# Patient Record
Sex: Female | Born: 1965 | Race: Black or African American | Hispanic: No | Smoking: Never smoker
Health system: Southern US, Community
[De-identification: ages and names within clinical notes are randomized; demographics above are authoritative.]

## PROBLEM LIST (undated history)

## (undated) DIAGNOSIS — G43909 Migraine, unspecified, not intractable, without status migrainosus: Secondary | ICD-10-CM

## (undated) HISTORY — DX: Migraine, unspecified, not intractable, without status migrainosus: G43.909

## (undated) HISTORY — PX: CHOLECYSTECTOMY: SHX55

## (undated) HISTORY — PX: BUNIONECTOMY: SHX129

---

## 1998-11-25 ENCOUNTER — Other Ambulatory Visit: Admission: RE | Admit: 1998-11-25 | Discharge: 1998-11-25 | Payer: Self-pay | Admitting: *Deleted

## 1999-05-01 ENCOUNTER — Inpatient Hospital Stay (HOSPITAL_COMMUNITY): Admission: AD | Admit: 1999-05-01 | Discharge: 1999-05-04 | Payer: Self-pay | Admitting: *Deleted

## 1999-05-04 ENCOUNTER — Encounter: Payer: Self-pay | Admitting: *Deleted

## 1999-06-28 ENCOUNTER — Inpatient Hospital Stay (HOSPITAL_COMMUNITY): Admission: AD | Admit: 1999-06-28 | Discharge: 1999-07-01 | Payer: Self-pay | Admitting: Obstetrics & Gynecology

## 1999-08-20 ENCOUNTER — Other Ambulatory Visit: Admission: RE | Admit: 1999-08-20 | Discharge: 1999-08-20 | Payer: Self-pay | Admitting: Obstetrics & Gynecology

## 2000-08-19 ENCOUNTER — Other Ambulatory Visit: Admission: RE | Admit: 2000-08-19 | Discharge: 2000-08-19 | Payer: Self-pay | Admitting: *Deleted

## 2001-09-12 ENCOUNTER — Other Ambulatory Visit: Admission: RE | Admit: 2001-09-12 | Discharge: 2001-09-12 | Payer: Self-pay | Admitting: Obstetrics and Gynecology

## 2002-09-14 ENCOUNTER — Other Ambulatory Visit: Admission: RE | Admit: 2002-09-14 | Discharge: 2002-09-14 | Payer: Self-pay | Admitting: Obstetrics and Gynecology

## 2005-02-11 ENCOUNTER — Encounter: Admission: RE | Admit: 2005-02-11 | Discharge: 2005-02-11 | Payer: Self-pay | Admitting: Obstetrics & Gynecology

## 2006-02-14 ENCOUNTER — Encounter: Admission: RE | Admit: 2006-02-14 | Discharge: 2006-02-14 | Payer: Self-pay | Admitting: Obstetrics and Gynecology

## 2006-02-23 ENCOUNTER — Encounter: Admission: RE | Admit: 2006-02-23 | Discharge: 2006-02-23 | Payer: Self-pay | Admitting: Obstetrics and Gynecology

## 2007-02-27 ENCOUNTER — Encounter: Admission: RE | Admit: 2007-02-27 | Discharge: 2007-02-27 | Payer: Self-pay | Admitting: Obstetrics and Gynecology

## 2008-02-28 ENCOUNTER — Encounter: Admission: RE | Admit: 2008-02-28 | Discharge: 2008-02-28 | Payer: Self-pay | Admitting: Obstetrics and Gynecology

## 2009-02-20 ENCOUNTER — Emergency Department (HOSPITAL_COMMUNITY): Admission: EM | Admit: 2009-02-20 | Discharge: 2009-02-20 | Payer: Self-pay | Admitting: Emergency Medicine

## 2009-02-28 ENCOUNTER — Encounter: Admission: RE | Admit: 2009-02-28 | Discharge: 2009-02-28 | Payer: Self-pay | Admitting: Obstetrics and Gynecology

## 2010-03-02 ENCOUNTER — Encounter
Admission: RE | Admit: 2010-03-02 | Discharge: 2010-03-02 | Payer: Self-pay | Source: Home / Self Care | Attending: Obstetrics and Gynecology | Admitting: Obstetrics and Gynecology

## 2010-03-08 ENCOUNTER — Encounter: Payer: Self-pay | Admitting: Obstetrics and Gynecology

## 2010-05-03 LAB — DIFFERENTIAL
Basophils Absolute: 0 10*3/uL (ref 0.0–0.1)
Basophils Relative: 0 % (ref 0–1)
Eosinophils Absolute: 0 10*3/uL (ref 0.0–0.7)
Eosinophils Relative: 1 % (ref 0–5)
Lymphocytes Relative: 19 % (ref 12–46)
Lymphs Abs: 1.1 10*3/uL (ref 0.7–4.0)
Monocytes Absolute: 0.5 10*3/uL (ref 0.1–1.0)
Monocytes Relative: 8 % (ref 3–12)
Neutro Abs: 4.3 10*3/uL (ref 1.7–7.7)
Neutrophils Relative %: 73 % (ref 43–77)

## 2010-05-03 LAB — CBC
HCT: 37.1 % (ref 36.0–46.0)
Hemoglobin: 12.2 g/dL (ref 12.0–15.0)
MCHC: 32.9 g/dL (ref 30.0–36.0)
MCV: 86.6 fL (ref 78.0–100.0)
Platelets: 225 K/uL (ref 150–400)
RBC: 4.29 MIL/uL (ref 3.87–5.11)
RDW: 14.6 % (ref 11.5–15.5)
WBC: 6 K/uL (ref 4.0–10.5)

## 2010-05-03 LAB — URINALYSIS, ROUTINE W REFLEX MICROSCOPIC
Bilirubin Urine: NEGATIVE
Glucose, UA: NEGATIVE mg/dL
Hgb urine dipstick: NEGATIVE
Ketones, ur: NEGATIVE mg/dL
Nitrite: NEGATIVE
Protein, ur: NEGATIVE mg/dL
Specific Gravity, Urine: 1.023 (ref 1.005–1.030)
Urobilinogen, UA: 1 mg/dL (ref 0.0–1.0)
pH: 8 (ref 5.0–8.0)

## 2010-05-03 LAB — CK TOTAL AND CKMB (NOT AT ARMC): CK, MB: 0.8 ng/mL (ref 0.3–4.0)

## 2010-05-03 LAB — BASIC METABOLIC PANEL
BUN: 7 mg/dL (ref 6–23)
CO2: 28 mEq/L (ref 19–32)
Calcium: 9 mg/dL (ref 8.4–10.5)
Chloride: 108 mEq/L (ref 96–112)
Creatinine, Ser: 0.63 mg/dL (ref 0.4–1.2)
GFR calc Af Amer: >60 mL/min (ref >60)
GFR calc non Af Amer: >60 mL/min (ref >60)
Glucose, Bld: 141 mg/dL — ABNORMAL HIGH (ref 70–99)
Potassium: 4 mEq/L (ref 3.5–5.1)
Sodium: 140 mEq/L (ref 135–145)

## 2010-07-03 NOTE — H&P (Signed)
[facility]  Patient:    Kayla Finley, Kayla Finley                      MRN: [id number] Adm. Date:  [id number] Attending:  Ardeen Fillers CC:         Sung Amabile. Roslyn Smiling, M.D.                         History and Physical  DATE OF BIRTH:                    1965/04/26  CHIEF COMPLAINT:                  Severe oligohydramnios at [redacted] weeks gestational age.  HISTORY OF PRESENT ILLNESS:       A 45 year old woman, G3, P1-0-1-1, EDC of Jul 07, 1999, based on dates and mid second trimester ultrasound, who was seen in the office for routine evaluation this morning.  Ultrasound was ordered because of patients history of oligohydramnios in the previous pregnancy.  Ultrasound revealed estimated fetal weight of 39% but severe oligohydramnios with and AFI f 8.2 which is considered equal to or less than the third percentile at [redacted] weeks gestational age.  Uterine Dopplers were normal today.  The patient is admitted ow for 48 hours of hydration and reevaluation of AFI after complete bed rest and hydration.  ANTENATAL COURSE:                 Remarkable for: 1. Advanced maternal age.  Amniocentesis was performed at [redacted] weeks gestational ge    and revealed normal female chromosomes and normal amniotic fluid    alpha-fetoprotein. 2. Anemia, treated with iron.  PAST MEDICAL HISTORY:             Medical: None.  Surgical: Cholecystectomy, cesarean section in 1997.  Obstetrical: 1998 first trimester TAB; 1997 full-term pregnancy at [redacted] weeks gestational age, 6 pound 46 ounce female delivered by cesarean section because of fetal distress.  ALLERGIES:                        No known drug allergies.  MEDICATIONS:                      Prenatal vitamins and iron.  FAMILY HISTORY:                   Mother with a history of hypertension and adult onset diabetes mellitus.  She died of breast cancer at age 15.  Father died of stroke.  SOCIAL HISTORY:                   Married.   Accountant.  Denies tobacco or ethanol use.  PHYSICAL EXAMINATION:  GENERAL:                          A healthy-appearing woman.  VITAL SIGNS:                      Afebrile.  Blood pressure 100/64.  Fetal heart rate 120s to 130s.  HEENT:                            Within normal limits.  Braces on teeth.  NECK:  Supple without thyromegaly.  CHEST:                            Clear.  COR:                              Regular rate and rhythm.  S1 and S2 normal.  ABDOMEN:                          Soft and nontender.  Fundal height 30 cm. Fundus nonirritable.  GU:                               Not examined.  EXTREMITIES:                      Clubbing, cyanosis, or edema.  NEUROLOGIC:                       DTRs 2+ without clonus.  ANTENATAL LABORATORY DATA:        AB positive, RPR nonreactive, rubella immune.  Hepatitis B surface antigen negative.  HIV nonreactive.  Gonorrhea and chlamydia cultures negative.  Amniotic fluid alpha-fetoprotein within normal limits. One-hour glucola 42 with a normal three-hour glucose tolerance test.  Group B Strep not performed.                                    Monitoring showed reactive tracing and no uterine contractions.  IMPRESSION:                       1. Severe oligohydramnios with intrauterine                                      pregnancy at 30+ weeks gestational age.                                   2. Rh positive.                                   3. History of cesarean section.  PLAN:                             Admit for IV hydration.  Strict bed rest over the weekend.  Reevaluate AFI on Monday, March 19.  Nonstress test twice daily. DD:  05/01/99 TD:  05/01/99 Job: [id number]

## 2010-07-03 NOTE — H&P (Signed)
[facility]  Patient:    Kayla Finley, Kayla Finley                      MRN: [id number] Adm. Date:  [id number] Attending:  Genia Del                         History and Physical  HISTORY OF PRESENT ILLNESS:  The patient is a 45 year old woman G3, P1, A1 with last menstrual period on September 29, 1998, for an expected date of delivery on Jul 07, 1999, at 38 weeks and 6 days gestation.  REASON FOR ADMISSION:  Uterine contractions every 2-5 minutes since 10 p.m. on Jun 28, 1999.  HISTORY OF PRESENT ILLNESS:  The patient noted more contractions x 24 hours before presentation but the contractions became regular every 3 to 5 minutes at 10 p.m. on Jun 28, 1999.  She had no fluid leak, no vaginal bleeding, good fetal movements and no PIH symptoms.  PAST MEDICAL HISTORY:  Negative.  PAST SURGICAL HISTORY:  Cholecystectomy.  PAST OBSTETRICAL HISTORY:  September 1997, 40 weeks induction for oligohydramnios and then fetal monitoring during labor with nonreassuring and a cesarean section was done, low transverse, baby female, 6 pounds 14 ounces. No other complications.  In 1988 at 6 weeks, therapeutic abortion, no complication.  ALLERGIES:  No known drug allergies.  SOCIAL HISTORY:  She is a nonsmoker and is married.  MEDICATIONS:  Prenatal vitamins.  HISTORY OF PRESENT PREGNANCY:  First trimester was normal.  The labs in the first trimester showed a hemoglobin of 11.6, platelets at 247, blood type rH AB positive, antibodies negative.  Sickle cell trait negative in 1997. Protein electrophoresis normal.  RPR nonreactive.  HBsAg negative.  HIV nonreactive, rubella immune, gonorrhea, chlamydia negative.  In the second trimester she had an amniocentesis showing 4 XY female normal chromosomes.  Her 20 week ultrasound showed normal review of anatomy with normal placenta and fluid.  She had a 1 hour GTT at 27 plus weeks which was increased.  The 3 hour GTT was  normal.  Group B strep at 35 plus weeks was positive.  Blood pressure remained normal throughout pregnancy.  REVIEW OF SYSTEMS:  Constitutional:  Negative.  Respiratory:  Negative. Cardiovascular:  Negative.  GI:  Negative.  Urologic:  Negative.  Neurologic: Negative.  PHYSICAL EXAMINATION:  GENERAL:  The patient is in no apparent distress except for the uterine contractions.  VITAL SIGNS:  Temperature was 97.7, pulse 84, respiratory rate 20 per minute, blood pressure 121/69.  HEENT:  Within normal limits.  LUNGS:  Clear bilaterally.  HEART:  Regular cardiac rhythm, no murmur.  ABDOMEN:  Gravid with a uterine height at 37 cm.  Nontender vertex.  VAGINAL EXAM ON ADMISSION:  Showed a cervix of 1 cm dilatation long, vertex minus 2 station.  Membranes were intact.  On my evaluation the cervix had changed from 3 to 3+ cm and was 90% effaced vertex -1,0 and the patient was in labor.  Artificial rupture of membranes was done and the amniotic fluid was clear.   Lower limbs had mild edema.  Monitoring fetal heart rates baseline was 140 per minute with good accelerations, no decelerations.  Uterine contractions every 2 to 4 minutes with a duration of 50 seconds 2+.  IMPRESSION:  A 45 year old woman G3, P1, A1 at 38 weeks and 6 days gestation in early labor with reassuring fetal well-being, vaginal birth after cesarean  section candidate, with group B strep positive.  PLAN:  Continuous monitoring, penicillin G to cover for Group B strep positive according to protocol.  Expectant towards a probable vaginal delivery. DD:  06/29/99 TD:  06/29/99 Job: [id number]

## 2010-07-03 NOTE — Discharge Summary (Signed)
[facility]  Patient:    Kayla Finley, Kayla Finley                      MRN: [id number] Adm. Date:  [id number] Disc. Date: [id number] Attending:  Ardeen Fillers CC:         Sung Amabile. Roslyn Smiling, M.D.                           Discharge Summary  DISCHARGE DIAGNOSES:          1. Oligohydramnios.                               2. Intrauterine pregnancy at [redacted] weeks gestational                                  age.  HISTORY OF PRESENT ILLNESS:   [age]-year-old woman, G3, P1-0-1-1, [facility] Jul 07, 1999, based on dates and mid second trimester ultrasound, who was seen in the office for routine evaluation on May 01, 1999.  Ultrasound was ordered because of the patients history of oligohydramnios in the previous pregnancy.  Ultrasound revealed an estimated fetal weight in the 39th percentile, but severe oligohydramnios with an AFI of 8.2 which is considered equal to or less than the third percentile at [redacted] weeks gestational age was noted.  Uterine Dopplers were normal.  The patient was admitted to [facility] for strict bed rest and aggressive IV hydration.  HOSPITAL COURSE:              She was kept at strict bed rest.  Intermittent monitoring was carried out and was reassuring.  Ultrasound was performed on May 04, 1999, which revealed an AFI of 11.3.  The patient was discharged to home with instructions for strict bed rest.  She will be followed up by Dr. Roslyn Smiling in one  week.  Kick counts were encouraged. DD:  05/20/99 TD:  05/21/99 Job: [id number]

## 2011-02-04 ENCOUNTER — Other Ambulatory Visit: Payer: Self-pay | Admitting: Obstetrics and Gynecology

## 2011-02-04 DIAGNOSIS — Z1231 Encounter for screening mammogram for malignant neoplasm of breast: Secondary | ICD-10-CM

## 2011-03-05 ENCOUNTER — Ambulatory Visit: Payer: Self-pay

## 2011-03-09 ENCOUNTER — Ambulatory Visit
Admission: RE | Admit: 2011-03-09 | Discharge: 2011-03-09 | Disposition: A | Discharge status: Home / Self Care | Payer: BC Managed Care – PPO | Source: Ambulatory Visit | Attending: Obstetrics and Gynecology | Admitting: Obstetrics and Gynecology

## 2011-03-09 DIAGNOSIS — Z1231 Encounter for screening mammogram for malignant neoplasm of breast: Secondary | ICD-10-CM

## 2012-02-04 ENCOUNTER — Other Ambulatory Visit: Payer: Self-pay | Admitting: Obstetrics and Gynecology

## 2012-02-04 DIAGNOSIS — Z1231 Encounter for screening mammogram for malignant neoplasm of breast: Secondary | ICD-10-CM

## 2012-03-10 ENCOUNTER — Ambulatory Visit
Admission: RE | Admit: 2012-03-10 | Discharge: 2012-03-10 | Disposition: A | Discharge status: Home / Self Care | Payer: BC Managed Care – PPO | Source: Ambulatory Visit | Attending: Obstetrics and Gynecology | Admitting: Obstetrics and Gynecology

## 2012-03-10 DIAGNOSIS — Z1231 Encounter for screening mammogram for malignant neoplasm of breast: Secondary | ICD-10-CM

## 2012-03-13 ENCOUNTER — Other Ambulatory Visit: Payer: Self-pay | Admitting: Obstetrics and Gynecology

## 2012-03-13 DIAGNOSIS — R928 Other abnormal and inconclusive findings on diagnostic imaging of breast: Secondary | ICD-10-CM

## 2012-03-17 ENCOUNTER — Ambulatory Visit
Admission: RE | Admit: 2012-03-17 | Discharge: 2012-03-17 | Disposition: A | Discharge status: Home / Self Care | Payer: BC Managed Care – PPO | Source: Ambulatory Visit | Attending: Obstetrics and Gynecology | Admitting: Obstetrics and Gynecology

## 2012-03-17 DIAGNOSIS — R928 Other abnormal and inconclusive findings on diagnostic imaging of breast: Secondary | ICD-10-CM

## 2013-02-07 ENCOUNTER — Other Ambulatory Visit: Payer: Self-pay

## 2013-02-07 DIAGNOSIS — Z1231 Encounter for screening mammogram for malignant neoplasm of breast: Secondary | ICD-10-CM

## 2013-03-12 ENCOUNTER — Ambulatory Visit
Admission: RE | Admit: 2013-03-12 | Discharge: 2013-03-12 | Disposition: A | Discharge status: Home / Self Care | Payer: Self-pay | Source: Ambulatory Visit

## 2013-03-12 DIAGNOSIS — Z1231 Encounter for screening mammogram for malignant neoplasm of breast: Secondary | ICD-10-CM

## 2013-11-21 ENCOUNTER — Other Ambulatory Visit: Payer: Self-pay | Admitting: Family Medicine

## 2013-11-21 ENCOUNTER — Ambulatory Visit
Admission: RE | Admit: 2013-11-21 | Discharge: 2013-11-21 | Disposition: A | Discharge status: Home / Self Care | Payer: BC Managed Care – PPO | Source: Ambulatory Visit | Attending: Family Medicine | Admitting: Family Medicine

## 2013-11-21 DIAGNOSIS — M25561 Pain in right knee: Secondary | ICD-10-CM

## 2013-12-28 ENCOUNTER — Other Ambulatory Visit: Payer: Self-pay | Admitting: *Deleted

## 2014-02-05 ENCOUNTER — Other Ambulatory Visit: Payer: Self-pay

## 2014-02-05 DIAGNOSIS — Z1231 Encounter for screening mammogram for malignant neoplasm of breast: Secondary | ICD-10-CM

## 2014-02-26 ENCOUNTER — Encounter: Payer: Self-pay | Admitting: Internal Medicine

## 2014-03-13 ENCOUNTER — Ambulatory Visit
Admission: RE | Admit: 2014-03-13 | Discharge: 2014-03-13 | Disposition: A | Discharge status: Home / Self Care | Payer: BC Managed Care – PPO | Source: Ambulatory Visit

## 2014-03-13 DIAGNOSIS — Z1231 Encounter for screening mammogram for malignant neoplasm of breast: Secondary | ICD-10-CM

## 2014-04-08 ENCOUNTER — Ambulatory Visit (AMBULATORY_SURGERY_CENTER): Payer: Self-pay | Admitting: *Deleted

## 2014-04-08 VITALS — Ht 65.0 in | Wt 134.0 lb

## 2014-04-08 DIAGNOSIS — Z1211 Encounter for screening for malignant neoplasm of colon: Secondary | ICD-10-CM

## 2014-04-08 MED ORDER — MOVIPREP 100 G PO SOLR: ORAL | Status: DC

## 2014-04-08 NOTE — Progress Notes (Signed)
No egg or soy allergy  No anesthesia or intubation problems per pt  No diet medications taken  Registered in EMMI   

## 2014-04-22 ENCOUNTER — Ambulatory Visit (AMBULATORY_SURGERY_CENTER): Payer: BC Managed Care – PPO | Admitting: Internal Medicine

## 2014-04-22 ENCOUNTER — Encounter: Payer: Self-pay | Admitting: Internal Medicine

## 2014-04-22 VITALS — BP 103/65 | HR 54 | Temp 97.7°F | Resp 19 | Ht 65.0 in | Wt 134.0 lb

## 2014-04-22 DIAGNOSIS — Z1211 Encounter for screening for malignant neoplasm of colon: Secondary | ICD-10-CM

## 2014-04-22 MED ORDER — SODIUM CHLORIDE 0.9 % IV SOLN: 500.0000 mL | INTRAVENOUS | Status: DC

## 2014-04-22 NOTE — Patient Instructions (Signed)
YOU HAD AN ENDOSCOPIC PROCEDURE TODAY AT THE [facility]:   Refer to the procedure report that was given to you for any specific questions about what was found during the examination.  If the procedure report does not answer your questions, please call your gastroenterologist to clarify.  If you requested that your care partner not be given the details of your procedure findings, then the procedure report has been included in a sealed envelope for you to review at your convenience later.  YOU SHOULD EXPECT: Some feelings of bloating in the abdomen. Passage of more gas than usual.  Walking can help get rid of the air that was put into your GI tract during the procedure and reduce the bloating. If you had a lower endoscopy (such as a colonoscopy or flexible sigmoidoscopy) you may notice spotting of blood in your stool or on the toilet paper. If you underwent a bowel prep for your procedure, you may not have a normal bowel movement for a few days.  Please Note:  You might notice some irritation and congestion in your nose or some drainage.  This is from the oxygen used during your procedure.  There is no need for concern and it should clear up in a day or so.  SYMPTOMS TO REPORT IMMEDIATELY:   Following lower endoscopy (colonoscopy or flexible sigmoidoscopy):  Excessive amounts of blood in the stool  Significant tenderness or worsening of abdominal pains  Swelling of the abdomen that is new, acute  Fever of 100F or higher   Following upper endoscopy (EGD)  Vomiting of blood or coffee ground material  New chest pain or pain under the shoulder blades  Painful or persistently difficult swallowing  New shortness of breath  Fever of 100F or higher  Black, tarry-looking stools  For urgent or emergent issues, a gastroenterologist can be reached at any hour by calling [phone].   DIET: Your first meal following the procedure should be a small meal and then it is ok to progress to  your normal diet. Heavy or fried foods are harder to digest and may make you feel nauseous or bloated.  Likewise, meals heavy in dairy and vegetables can increase bloating.  Drink plenty of fluids but you should avoid alcoholic beverages for 24 hours. Try to increase your fiber in your diet.  ACTIVITY:  You should plan to take it easy for the rest of today and you should NOT DRIVE or use heavy machinery until tomorrow (because of the sedation medicines used during the test).    FOLLOW UP: Our staff will call the number listed on your records the next business day following your procedure to check on you and address any questions or concerns that you may have regarding the information given to you following your procedure. If we do not reach you, we will leave a message.  However, if you are feeling well and you are not experiencing any problems, there is no need to return our call.  We will assume that you have returned to your regular daily activities without incident.  If any biopsies were taken you will be contacted by phone or by letter within the next 1-3 weeks.  Please call us at [phone] if you have not heard about the biopsies in 3 weeks.    SIGNATURES/CONFIDENTIALITY: You and/or your care partner have signed paperwork which will be entered into your electronic medical record.  These signatures attest to the fact that that the information above  on your After Visit Summary has been reviewed and is understood.  Full responsibility of the confidentiality of this discharge information lies with you and/or your care-partner.  Read the handouts given to you by your recovery room nurse.

## 2014-04-22 NOTE — Progress Notes (Signed)
A/ox3, pleased with MAC, report to RN 

## 2014-04-22 NOTE — Op Note (Signed)
 [facility] [address] [place], [id number]   COLONOSCOPY PROCEDURE REPORT  PATIENT: Kayla Finley, [name]  MR#: [id number] BIRTHDATE: [birth year] , 48  yrs. old GENDER: female ENDOSCOPIST: Roxy CedarJohn N Eston Heslin Jr, MD REFERRED WN:UUVOZBY:Aaron Vincente LibertyS Morrow, M.D. PROCEDURE DATE:  04/22/2014 PROCEDURE:   Colonoscopy, screening First Screening Colonoscopy - Avg.  risk and is 50 yrs.  old or older Yes.  Prior Negative Screening - Now for repeat screening. N/A  History of Adenoma - Now for follow-up colonoscopy & has been > or = to 3 yrs.  N/A  Polyps Removed Today? No.  Recommend repeat exam, <10 yrs? No. ASA CLASS:   Class II INDICATIONS:average risk patient for colorectal cancer, Screening for colonic neoplasia, and Average Risk. MEDICATIONS: Monitored anesthesia care and Propofol 200 mg IV  DESCRIPTION OF PROCEDURE:   After the risks benefits and alternatives of the procedure were thoroughly explained, informed consent was obtained.  The digital rectal exam revealed no abnormalities of the rectum.   The LB [id number]  endoscope was introduced through the anus and advanced to the cecum, which was identified by both the appendix and ileocecal valve. No adverse events experienced.   The quality of the prep was excellent.  using MoviPrep  The instrument was then slowly withdrawn as the colon was fully examined.      COLON FINDINGS: There was mild diverticulosis noted in the sigmoid colon.   The examination was otherwise normal.  Retroflexed views revealed no abnormalities. The time to cecum = 1.42 Withdrawal time = 8.56   The scope was withdrawn and the procedure completed.  COMPLICATIONS: There were no immediate complications.  ENDOSCOPIC IMPRESSION: 1.   Mild diverticulosis was noted in the sigmoid colon 2.   The examination was otherwise normal  RECOMMENDATIONS: 1. Continue current colorectal screening recommendations for "routine risk" patients with a repeat colonoscopy  in 10 years.  eSigned:  Roxy CedarJohn N Dontrelle Mazon Jr, MD 04/22/2014 8:33 AM   cc: Farris HasMorrow, Aaron MD and The Patient

## 2014-04-22 NOTE — Progress Notes (Signed)
May discharge in 20 minutes if stable per Dr. Perry. 

## 2014-04-23 ENCOUNTER — Telehealth: Payer: Self-pay | Admitting: *Deleted

## 2014-04-23 NOTE — Telephone Encounter (Signed)
  Follow up Call-  Call back number 04/22/2014  Post procedure Call Back phone  # [phone]  Permission to leave phone message Yes   [facility]

## 2014-04-25 NOTE — Addendum Note (Signed)
Addended by: Noela Brothers W on: 04/25/2014 01:58 PM   Modules accepted: Level of Service  

## 2015-02-06 ENCOUNTER — Other Ambulatory Visit: Payer: Self-pay

## 2015-02-06 DIAGNOSIS — Z1231 Encounter for screening mammogram for malignant neoplasm of breast: Secondary | ICD-10-CM

## 2015-03-17 ENCOUNTER — Ambulatory Visit
Admission: RE | Admit: 2015-03-17 | Discharge: 2015-03-17 | Disposition: A | Discharge status: Home / Self Care | Payer: BC Managed Care – PPO | Source: Ambulatory Visit

## 2015-03-17 DIAGNOSIS — Z1231 Encounter for screening mammogram for malignant neoplasm of breast: Secondary | ICD-10-CM

## 2015-03-18 ENCOUNTER — Other Ambulatory Visit: Payer: Self-pay | Admitting: Obstetrics and Gynecology

## 2015-03-18 DIAGNOSIS — R928 Other abnormal and inconclusive findings on diagnostic imaging of breast: Secondary | ICD-10-CM

## 2015-03-24 ENCOUNTER — Ambulatory Visit
Admission: RE | Admit: 2015-03-24 | Discharge: 2015-03-24 | Disposition: A | Discharge status: Home / Self Care | Payer: BC Managed Care – PPO | Source: Ambulatory Visit | Attending: Obstetrics and Gynecology | Admitting: Obstetrics and Gynecology

## 2015-03-24 DIAGNOSIS — R928 Other abnormal and inconclusive findings on diagnostic imaging of breast: Secondary | ICD-10-CM

## 2016-01-20 ENCOUNTER — Ambulatory Visit: Payer: Self-pay | Admitting: Sports Medicine

## 2016-01-29 ENCOUNTER — Ambulatory Visit (INDEPENDENT_AMBULATORY_CARE_PROVIDER_SITE_OTHER): Payer: BC Managed Care – PPO | Admitting: Podiatry

## 2016-01-29 ENCOUNTER — Encounter: Payer: Self-pay | Admitting: Podiatry

## 2016-01-29 VITALS — BP 122/66 | HR 74 | Resp 16 | Ht 65.0 in | Wt 139.0 lb

## 2016-01-29 DIAGNOSIS — B351 Tinea unguium: Secondary | ICD-10-CM

## 2016-01-29 DIAGNOSIS — L603 Nail dystrophy: Secondary | ICD-10-CM

## 2016-01-29 MED ORDER — TERBINAFINE HCL 250 MG PO TABS: ORAL_TABLET | ORAL | 0 refills | Status: DC

## 2016-01-29 NOTE — Progress Notes (Signed)
Subjective:     Patient ID: Kayla Finley, female   DOB: 08/20/1965, 50 y.o.   MRN: [id number]  HPI patient states my nail disease bilateral with thickness subungual debris and history of having taken Lamisil which is improved the condition moderately but it is still quite bothersome for her   Review of Systems  All other systems reviewed and are negative.      Objective:   Physical Exam  Constitutional: She is oriented to person, place, and time.  Cardiovascular: Intact distal pulses.   Musculoskeletal: Normal range of motion.  Neurological: She is oriented to person, place, and time.  Skin: Skin is warm.  Nursing note and vitals reviewed.  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 I explained this may or may not give better correction but I do think it offers a better chance of good result

## 2016-01-29 NOTE — Progress Notes (Signed)
   Subjective:    Patient ID: Kayla Finley, female    DOB: 07/12/1965, 50 y.o.   MRN: [id number]  HPI Chief Complaint  Patient presents with  . Nail Problem    Bilateral; all toes; nail discoloration and thickened nails; pt stated, "Just finished Lamisil in mid-Augus 2017; needs to have all nails checked for nail fungus"      Review of Systems  All other systems reviewed and are negative.      Objective:   Physical Exam        Assessment & Plan:

## 2016-02-25 ENCOUNTER — Other Ambulatory Visit: Payer: Self-pay | Admitting: Obstetrics and Gynecology

## 2016-02-25 DIAGNOSIS — Z1231 Encounter for screening mammogram for malignant neoplasm of breast: Secondary | ICD-10-CM

## 2016-03-04 ENCOUNTER — Other Ambulatory Visit: Payer: BC Managed Care – PPO

## 2016-03-15 ENCOUNTER — Ambulatory Visit: Payer: BC Managed Care – PPO

## 2016-03-15 DIAGNOSIS — B351 Tinea unguium: Secondary | ICD-10-CM

## 2016-03-17 ENCOUNTER — Ambulatory Visit
Admission: RE | Admit: 2016-03-17 | Discharge: 2016-03-17 | Disposition: A | Discharge status: Home / Self Care | Payer: BC Managed Care – PPO | Source: Ambulatory Visit | Attending: Obstetrics and Gynecology | Admitting: Obstetrics and Gynecology

## 2016-03-17 DIAGNOSIS — Z1231 Encounter for screening mammogram for malignant neoplasm of breast: Secondary | ICD-10-CM

## 2016-03-22 NOTE — Progress Notes (Signed)
Pt presents with mycotic infection of nails Rt 1,2 Lt 1,2,4  All other systems are negative  Laser therapy administered to affected nails and tolerated well. All safety precautions were in place. Re-appointed in 4 weeks for 2nd treatment

## 2016-04-14 ENCOUNTER — Ambulatory Visit: Payer: BC Managed Care – PPO

## 2016-04-14 DIAGNOSIS — B351 Tinea unguium: Secondary | ICD-10-CM

## 2016-04-16 NOTE — Progress Notes (Signed)
Pt presents with mycotic infection of nails Rt 1,2 Lt 1,2,4  All other systems are negative  Laser therapy administered to affected nails and tolerated well. All safety precautions were in place. Re-appointed in 4 weeks for 2nd treatment

## 2016-05-12 ENCOUNTER — Telehealth: Payer: Self-pay

## 2016-05-12 ENCOUNTER — Ambulatory Visit (INDEPENDENT_AMBULATORY_CARE_PROVIDER_SITE_OTHER): Payer: Self-pay | Admitting: Podiatry

## 2016-05-12 DIAGNOSIS — B351 Tinea unguium: Secondary | ICD-10-CM

## 2016-05-20 NOTE — Progress Notes (Signed)
Pt presents with mycotic infection of nails Rt 1,2 Lt 1,2,4  All other systems are negative  Laser therapy administered to affected nails and tolerated well. All safety precautions were in place. Re-appointed in 4 weeks for 4th treatment

## 2016-11-05 NOTE — Telephone Encounter (Signed)
error 

## 2017-02-10 ENCOUNTER — Other Ambulatory Visit: Payer: Self-pay | Admitting: Obstetrics and Gynecology

## 2017-02-10 DIAGNOSIS — Z1231 Encounter for screening mammogram for malignant neoplasm of breast: Secondary | ICD-10-CM

## 2017-02-22 ENCOUNTER — Other Ambulatory Visit: Payer: Self-pay | Admitting: Family Medicine

## 2017-02-22 ENCOUNTER — Ambulatory Visit
Admission: RE | Admit: 2017-02-22 | Discharge: 2017-02-22 | Disposition: A | Discharge status: Home / Self Care | Payer: BC Managed Care – PPO | Source: Ambulatory Visit | Attending: Family Medicine | Admitting: Family Medicine

## 2017-02-22 DIAGNOSIS — M79642 Pain in left hand: Secondary | ICD-10-CM

## 2017-03-21 ENCOUNTER — Ambulatory Visit
Admission: RE | Admit: 2017-03-21 | Discharge: 2017-03-21 | Disposition: A | Discharge status: Home / Self Care | Payer: BC Managed Care – PPO | Source: Ambulatory Visit | Attending: Obstetrics and Gynecology | Admitting: Obstetrics and Gynecology

## 2017-03-21 DIAGNOSIS — Z1231 Encounter for screening mammogram for malignant neoplasm of breast: Secondary | ICD-10-CM

## 2018-02-13 ENCOUNTER — Other Ambulatory Visit: Payer: Self-pay | Admitting: Obstetrics and Gynecology

## 2018-02-13 DIAGNOSIS — Z1231 Encounter for screening mammogram for malignant neoplasm of breast: Secondary | ICD-10-CM

## 2018-03-22 ENCOUNTER — Ambulatory Visit
Admission: RE | Admit: 2018-03-22 | Discharge: 2018-03-22 | Disposition: A | Discharge status: Home / Self Care | Payer: BC Managed Care – PPO | Source: Ambulatory Visit | Attending: Obstetrics and Gynecology | Admitting: Obstetrics and Gynecology

## 2018-03-22 DIAGNOSIS — Z1231 Encounter for screening mammogram for malignant neoplasm of breast: Secondary | ICD-10-CM

## 2018-03-23 ENCOUNTER — Other Ambulatory Visit: Payer: Self-pay | Admitting: Obstetrics and Gynecology

## 2018-03-23 ENCOUNTER — Encounter: Payer: Self-pay | Admitting: Cardiology

## 2018-03-23 ENCOUNTER — Ambulatory Visit: Payer: BC Managed Care – PPO | Admitting: Cardiology

## 2018-03-23 VITALS — BP 122/72 | HR 81 | Ht 65.5 in | Wt 145.6 lb

## 2018-03-23 DIAGNOSIS — R928 Other abnormal and inconclusive findings on diagnostic imaging of breast: Secondary | ICD-10-CM

## 2018-03-23 DIAGNOSIS — Z7189 Other specified counseling: Secondary | ICD-10-CM

## 2018-03-23 DIAGNOSIS — R002 Palpitations: Secondary | ICD-10-CM | POA: Diagnosis not present

## 2018-03-23 NOTE — Patient Instructions (Signed)
Medication Instructions:  Your Physician recommend you continue on your current medication as directed.    If you need a refill on your cardiac medications before your next appointment, please call your pharmacy.   Lab work: None  Testing/Procedures: Our physician has recommended that you wear an 14 DAY ZIO-PATCH monitor. The Zio patch cardiac monitor continuously records heart rhythm data for up to 14 days, this is for patients being evaluated for multiple types heart rhythms. For the first 24 hours post application, please avoid getting the Zio monitor wet in the shower or by excessive sweating during exercise. After that, feel free to carry on with regular activities. Keep soaps and lotions away from the ZIO XT Patch.   This will be placed at our [address] location - [address], [address].      Follow-Up: At [facility], you and your health needs are our priority.  As part of our continuing mission to provide you with exceptional heart care, we have created designated Provider Care Teams.  These Care Teams include your primary Cardiologist (physician) and Advanced Practice Providers (APPs -  Physician Assistants and Nurse Practitioners) who all work together to provide you with the care you need, when you need it. You will need a follow up appointment as needed.  Please call our office 2 months in advance to schedule this appointment.  You may see Bridgette Christopher, MD or one of the following Advanced Practice Providers on your designated Care Team:   Rhonda Barrett, PA-C . Kathryn Lawrence, DNP, ANP    

## 2018-03-23 NOTE — Progress Notes (Signed)
Cardiology Office Note:    Date:  03/23/2018   ID:  Kayla Finley, DOB 03/12/1965, MRN [id number]  PCP:  Farris HasMorrow, Aaron, MD  Cardiologist:  Jodelle RedBridgette Nita Whitmire, MD PhD  Referring MD: Farris HasMorrow, Aaron, MD   CC: establish care/palpitations  History of Present Illness:    Kayla Shelterngela Cundy is a 53 y.o. female with a hx of migraines, allergies who is seen as a new consult at the request of Farris HasMorrow, Aaron, MD for the evaluation and management of palpitations.  Tachycardia/palpitations: -Initial onset: late last year, when she had a migraine. Felt after she took migraine medicine in the evening, laid down and felt her heart pounding. Was gone when she woke up the next morning.  -Frequency: was infrequent until recently, then recently lasted about a week continuously -Duration: recently was about a week, now back to being rare and intermittent; about 1-2 times/week. -Associated symptoms: no chest tightness, did not stop her from working out or doing her regular activities.  -Aggravating/alleviating factors: migraines make it worse, better when she started taking tums at night. -Syncope/near syncope: none -Prior cardiac history: none -Prior ECG: normal -Prior workup: none -Prior treatment: none -Possible medication interactions: was on relpax (triptan) for migraines, none in January -Caffeine: 1-2 cups/day. -Alcohol: rarely -Tobacco: never -OTC supplements: none other than vitamins -Comorbidities: none -Exercise level: active, works out (runs on treadmill, elliptical, rides bike, weight lifting) at least 3x/week -Labs: TSH, kidney function/electrolytes, CBC reviewed. -Cardiac ROS: no chest pain, no shortness of breath, no PND, no orthopnea, no LE edema. -Family history: father had a stroke in his 4270s (deceased now), mother ws diabetic, not sure if she had heart issues, had cancer (deceased in [id number]). Two brothers diabetic, but no heart disease. Sister with HTN.  Past Medical History:    Diagnosis Date  . Migraine     Past Surgical History:  Procedure Laterality Date  . BUNIONECTOMY     both feet  . CESAREAN SECTION    . CHOLECYSTECTOMY      Current Medications: Current Outpatient Medications on File Prior to Visit  Medication Sig  . Cetirizine HCl (ZYRTEC ALLERGY PO) Take by mouth daily as needed.  . fluticasone (FLONASE) 50 MCG/ACT nasal spray Place 1 spray into both nostrils as needed.   . Multiple Vitamins-Minerals (MULTIVITAMIN ADULT PO) Take by mouth daily.   No current facility-administered medications on file prior to visit.      Allergies:   Patient has no known allergies.   Social History   Socioeconomic History  . Marital status: Married    Spouse name: Not on file  . Number of children: Not on file  . Years of education: Not on file  . Highest education level: Not on file  Occupational History  . Not on file  Social Needs  . Financial resource strain: Not on file  . Food insecurity:    Worry: Not on file    Inability: Not on file  . Transportation needs:    Medical: Not on file    Non-medical: Not on file  Tobacco Use  . Smoking status: Never Smoker  . Smokeless tobacco: Never Used  Substance and Sexual Activity  . Alcohol use: Yes    Alcohol/week: 0.0 standard drinks    Comment: very rare use  . Drug use: No  . Sexual activity: Not on file  Lifestyle  . Physical activity:    Days per week: Not on file    Minutes per session: Not on file  .  Stress: Not on file  Relationships  . Social connections:    Talks on phone: Not on file    Gets together: Not on file    Attends religious service: Not on file    Active member of club or organization: Not on file    Attends meetings of clubs or organizations: Not on file    Relationship status: Not on file  Other Topics Concern  . Not on file  Social History Narrative  . Not on file     Family History: The patient's family history is negative for Colon cancer, Esophageal  cancer, Rectal cancer, and Stomach cancer. father had a stroke in his 55s (deceased now), mother ws diabetic, not sure if she had heart issues, had cancer (deceased in 68). Two brothers diabetic, but no heart disease. Sister with HTN.  ROS:   Please see the history of present illness.  Additional pertinent ROS:  Constitutional: Negative for chills, fever, night sweats, unintentional weight loss  HENT: Negative for ear pain and hearing loss.   Eyes: Negative for loss of vision and eye pain.  Respiratory: Negative for cough, sputum, shortness of breath, wheezing.   Cardiovascular: See HPI. Gastrointestinal: Negative for abdominal pain, melena, and hematochezia.  Genitourinary: Negative for dysuria and hematuria.  Musculoskeletal: Negative for falls and myalgias.  Skin: Negative for itching and rash.  Neurological: Negative for focal weakness, focal sensory changes and loss of consciousness.  Endo/Heme/Allergies: Does not bruise/bleed easily.    EKGs/Labs/Other Studies Reviewed:    The following studies were reviewed today: Note from PCP  EKG:  EKG is personally reviewed.  The ekg ordered today demonstrates normal sinus rhythm  Recent Labs: No results found for requested labs within last 8760 hours.  Recent Lipid Panel No results found for: CHOL, TRIG, HDL, CHOLHDL, VLDL, LDLCALC, LDLDIRECT  Per [facility]: Tchol 174, HDL 57, LDL 107, TG 48.  Cr 0.67 TSH 0.67  Physical Exam:    VS:  BP 122/72   Pulse 81   Ht 5' 5.5" (1.664 m)   Wt 145 lb 9.6 oz (66 kg)   BMI 23.86 kg/m     Wt Readings from Last 3 Encounters:  03/23/18 145 lb 9.6 oz (66 kg)  01/29/16 139 lb (63 kg)  04/22/14 134 lb (60.8 kg)     GEN: Well nourished, well developed in no acute distress HEENT: Normal NECK: No JVD; No carotid bruits LYMPHATICS: No lymphadenopathy CARDIAC: regular rhythm, normal S1 and S2, no murmurs, rubs, gallops. Radial and DP pulses 2+ bilaterally. RESPIRATORY:  Clear to auscultation  without rales, wheezing or rhonchi  ABDOMEN: Soft, non-tender, non-distended MUSCULOSKELETAL:  No edema; No deformity  SKIN: Warm and dry NEUROLOGIC:  Alert and oriented x 3 PSYCHIATRIC:  Normal affect   ASSESSMENT:    1. Palpitations   2. Counseling on health promotion and disease prevention    PLAN:    1. Palpitations: will order 14 day monitor for further assessment. If any significant abnormalities noted on monitor, will get echo for further evaluation  2. Primary prevention -recommend heart healthy/Mediterranean diet, with whole grains, fruits, vegetable, fish, lean meats, nuts, and olive oil. Limit salt. -already exceeds the AHA 150 min/week guidelines for exercise -recommend avoidance of tobacco products. Avoid excess alcohol.  Plan for follow up: if monitor unremarkable, follow up as needed  Medication Adjustments/Labs and Tests Ordered: Current medicines are reviewed at length with the patient today.  Concerns regarding medicines are outlined above.  Orders Placed This  Encounter  Procedures  . LONG TERM MONITOR (3-14 DAYS)  . EKG 12-Lead   No orders of the defined types were placed in this encounter.   Patient Instructions  Medication Instructions:  Your Physician recommend you continue on your current medication as directed.    If you need a refill on your cardiac medications before your next appointment, please call your pharmacy.   Lab work: None  Testing/Procedures: Our physician has recommended that you wear an 14 DAY ZIO-PATCH monitor. The Zio patch cardiac monitor continuously records heart rhythm data for up to 14 days, this is for patients being evaluated for multiple types heart rhythms. For the first 24 hours post application, please avoid getting the Zio monitor wet in the shower or by excessive sweating during exercise. After that, feel free to carry on with regular activities. Keep soaps and lotions away from the ZIO XT Patch.   This will be placed  at our [facility] location - [address].      Follow-Up: At [facility], you and your health needs are our priority.  As part of our continuing mission to provide you with exceptional heart care, we have created designated Provider Care Teams.  These Care Teams include your primary Cardiologist (physician) and Advanced Practice Providers (APPs -  Physician Assistants and Nurse Practitioners) who all work together to provide you with the care you need, when you need it. You will need a follow up appointment as needed.  Please call our office 2 months in advance to schedule this appointment.  You may see Jodelle Red, MD or one of the following Advanced Practice Providers on your designated Care Team:   Theodore Demark, PA-C . Joni Reining, DNP, ANP       Signed, Jodelle Red, MD PhD 03/23/2018 9:17 AM    [facility]

## 2018-04-04 ENCOUNTER — Ambulatory Visit
Admission: RE | Admit: 2018-04-04 | Discharge: 2018-04-04 | Disposition: A | Discharge status: Home / Self Care | Payer: BC Managed Care – PPO | Source: Ambulatory Visit | Attending: Obstetrics and Gynecology | Admitting: Obstetrics and Gynecology

## 2018-04-04 ENCOUNTER — Ambulatory Visit (INDEPENDENT_AMBULATORY_CARE_PROVIDER_SITE_OTHER): Payer: BC Managed Care – PPO

## 2018-04-04 DIAGNOSIS — R002 Palpitations: Secondary | ICD-10-CM

## 2018-04-04 DIAGNOSIS — R928 Other abnormal and inconclusive findings on diagnostic imaging of breast: Secondary | ICD-10-CM

## 2018-04-28 ENCOUNTER — Telehealth: Payer: Self-pay | Admitting: Cardiology

## 2018-04-28 NOTE — Telephone Encounter (Signed)
Notes recorded by Jodelle Red, MD on 04/27/2018 at 11:08 AM EDT There were only 4 very brief episodes of fast rhythm, and these actually didn't occur when she had symptoms. Of the 4 patient triggered events, one was normal sinus rhythm, and three were sinus rhythm with PVCs. No long lasting or dangerous rhythms. I would not start a medication based on how infrequent and short the events are. If the frequency becomes more frequent, ask her to call us back and we will get an echocardiogram and see her back sooner. Thanks.  The patient has been notified of the result and verbalized understanding.  All questions (if any) were answered. Tobin Chad, RN 04/28/2018 10:15 AM

## 2018-04-28 NOTE — Telephone Encounter (Signed)
  Patient returning call regarding monitor results ?

## 2018-10-31 ENCOUNTER — Other Ambulatory Visit: Payer: Self-pay | Admitting: Family Medicine

## 2018-10-31 DIAGNOSIS — M7989 Other specified soft tissue disorders: Secondary | ICD-10-CM

## 2018-11-02 ENCOUNTER — Ambulatory Visit
Admission: RE | Admit: 2018-11-02 | Discharge: 2018-11-02 | Disposition: A | Discharge status: Home / Self Care | Payer: BC Managed Care – PPO | Source: Ambulatory Visit | Attending: Family Medicine | Admitting: Family Medicine

## 2018-11-02 DIAGNOSIS — M7989 Other specified soft tissue disorders: Secondary | ICD-10-CM

## 2018-12-22 ENCOUNTER — Other Ambulatory Visit: Payer: Self-pay | Admitting: Otolaryngology

## 2018-12-22 DIAGNOSIS — K118 Other diseases of salivary glands: Secondary | ICD-10-CM

## 2018-12-22 DIAGNOSIS — R220 Localized swelling, mass and lump, head: Secondary | ICD-10-CM

## 2019-01-01 ENCOUNTER — Ambulatory Visit
Admission: RE | Admit: 2019-01-01 | Discharge: 2019-01-01 | Disposition: A | Discharge status: Home / Self Care | Payer: BC Managed Care – PPO | Source: Ambulatory Visit | Attending: Otolaryngology | Admitting: Otolaryngology

## 2019-01-01 DIAGNOSIS — K118 Other diseases of salivary glands: Secondary | ICD-10-CM

## 2019-01-01 DIAGNOSIS — R220 Localized swelling, mass and lump, head: Secondary | ICD-10-CM

## 2019-01-01 MED ORDER — IOPAMIDOL (ISOVUE-300) INJECTION 61%
75.0000 mL | Freq: Once | INTRAVENOUS | Status: AC | PRN
  Administered 2019-01-01: 75 mL via INTRAVENOUS

## 2019-02-13 DIAGNOSIS — T8141XA Infection following a procedure, superficial incisional surgical site, initial encounter: Secondary | ICD-10-CM | POA: Insufficient documentation

## 2019-03-01 ENCOUNTER — Other Ambulatory Visit: Payer: Self-pay | Admitting: Obstetrics and Gynecology

## 2019-03-01 DIAGNOSIS — Z1231 Encounter for screening mammogram for malignant neoplasm of breast: Secondary | ICD-10-CM

## 2019-03-29 ENCOUNTER — Ambulatory Visit: Payer: BC Managed Care – PPO | Attending: Family

## 2019-03-29 DIAGNOSIS — Z23 Encounter for immunization: Secondary | ICD-10-CM

## 2019-03-29 NOTE — Progress Notes (Signed)
   Covid-19 Vaccination Clinic  Name:  Kayla Finley    MRN: [id number] DOB: 01-25-1966  03/29/2019  [name] was observed post Covid-19 immunization for 15 minutes without incidence. She was provided with Vaccine Information Sheet and instruction to access the V-Safe system.   [name] was instructed to call 911 with any severe reactions post vaccine: Marland Kitchen Difficulty breathing  . Swelling of your face and throat  . A fast heartbeat  . A bad rash all over your body  . Dizziness and weakness    Immunizations Administered    Name Date Dose VIS Date Route   Moderna COVID-19 Vaccine 03/29/2019  5:37 PM 0.5 mL 01/16/2019 Intramuscular   Manufacturer: Moderna   Lot: [id number]   NDC: 48628-241-75

## 2019-04-09 ENCOUNTER — Ambulatory Visit: Payer: BC Managed Care – PPO

## 2019-05-01 ENCOUNTER — Ambulatory Visit: Payer: BC Managed Care – PPO | Attending: Family

## 2019-05-01 DIAGNOSIS — Z23 Encounter for immunization: Secondary | ICD-10-CM

## 2019-05-01 NOTE — Progress Notes (Signed)
   Covid-19 Vaccination Clinic  Name:  Derry Arbogast    MRN: [id number] DOB: [birth year]  05/01/2019  Ms. Lembo was observed post Covid-19 immunization for 15 minutes without incident. She was provided with Vaccine Information Sheet and instruction to access the V-Safe system.   Ms. Briski was instructed to call 911 with any severe reactions post vaccine: Marland Kitchen Difficulty breathing  . Swelling of face and throat  . A fast heartbeat  . A bad rash all over body  . Dizziness and weakness   Immunizations Administered    Name Date Dose VIS Date Route   Moderna COVID-19 Vaccine 05/01/2019  2:11 PM 0.5 mL 01/16/2019 Intramuscular   Manufacturer: Moderna   Lot: [id number]   NDC: 11464-314-27

## 2019-06-12 ENCOUNTER — Other Ambulatory Visit: Payer: Self-pay

## 2019-06-12 ENCOUNTER — Ambulatory Visit
Admission: RE | Admit: 2019-06-12 | Discharge: 2019-06-12 | Disposition: A | Discharge status: Home / Self Care | Payer: BC Managed Care – PPO | Source: Ambulatory Visit | Attending: Obstetrics and Gynecology | Admitting: Obstetrics and Gynecology

## 2019-06-12 DIAGNOSIS — Z1231 Encounter for screening mammogram for malignant neoplasm of breast: Secondary | ICD-10-CM

## 2019-09-07 IMAGING — CR DG HAND COMPLETE 3+V*L*
3 series · 3 of 3 positions shown · non-contrast
Comparison: None in PACs

CLINICAL DATA: Several month history of left thumb pain which has
worsened over the past 2 weeks. No known trauma. Pain is centered
over the first CMC joint.

EXAM:
LEFT HAND - COMPLETE 3+ VIEW

[x hand pa left]
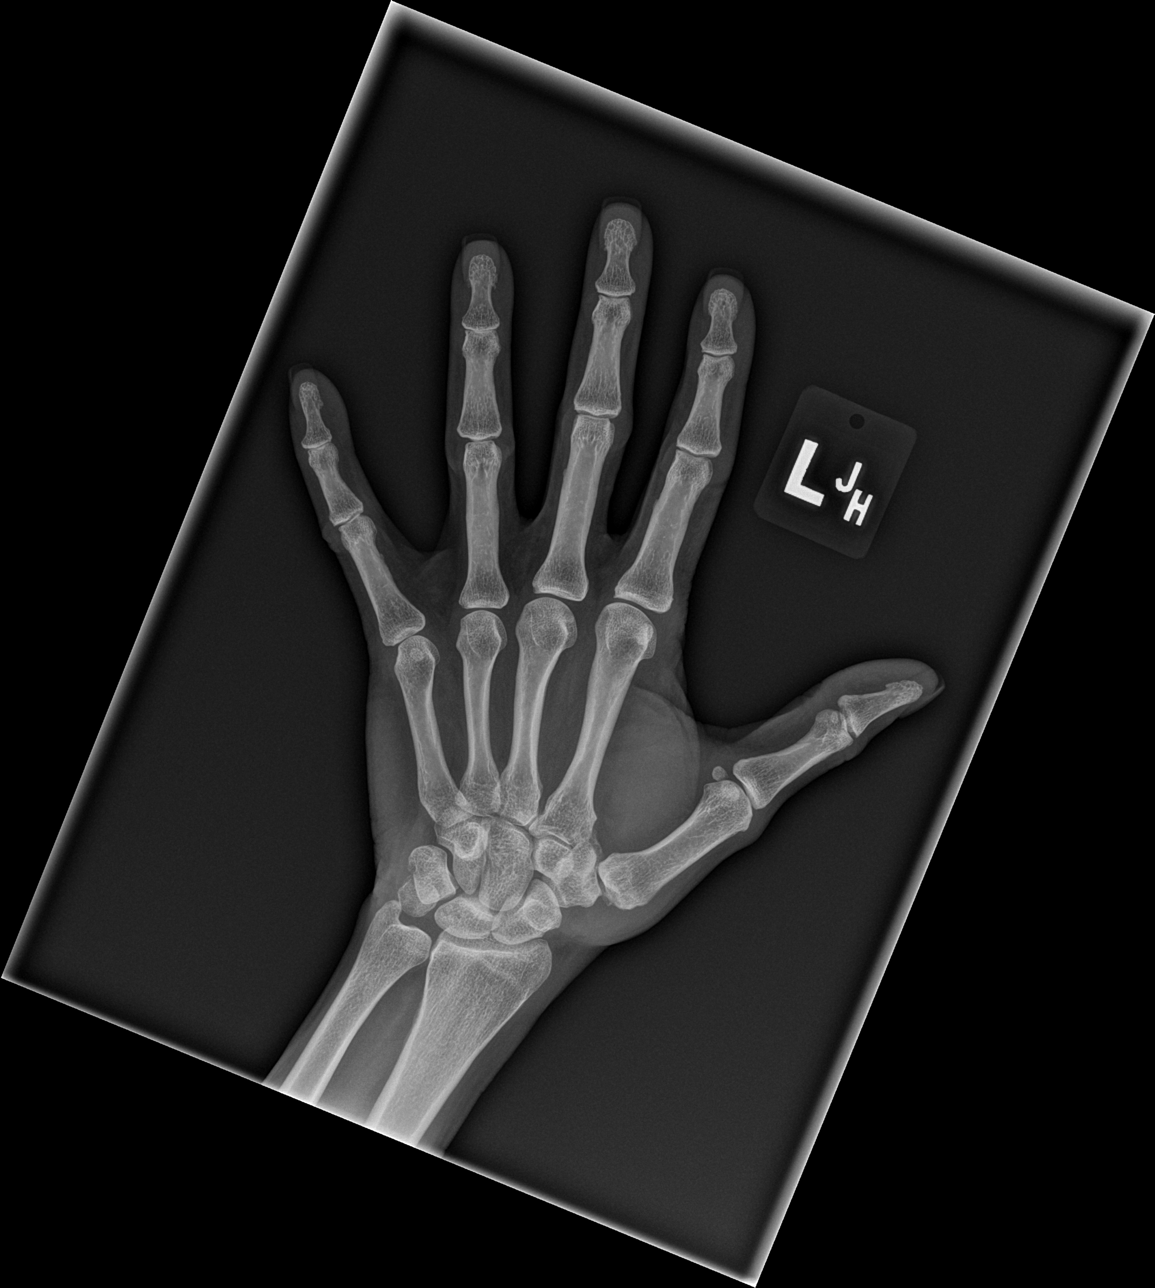

[x hand obl left]
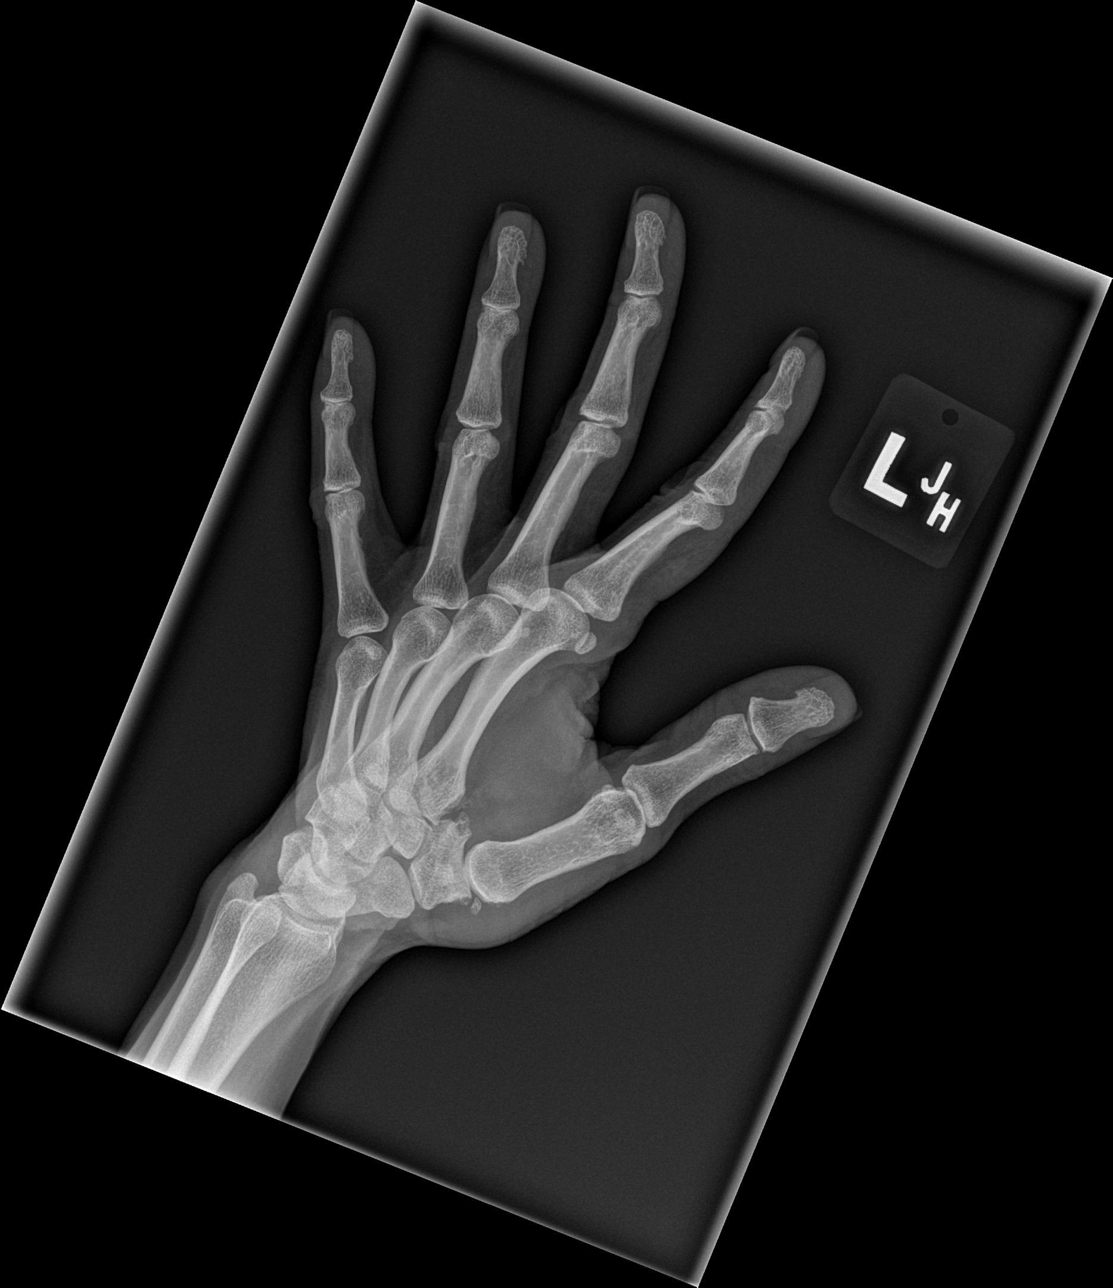

[x hand lat left]
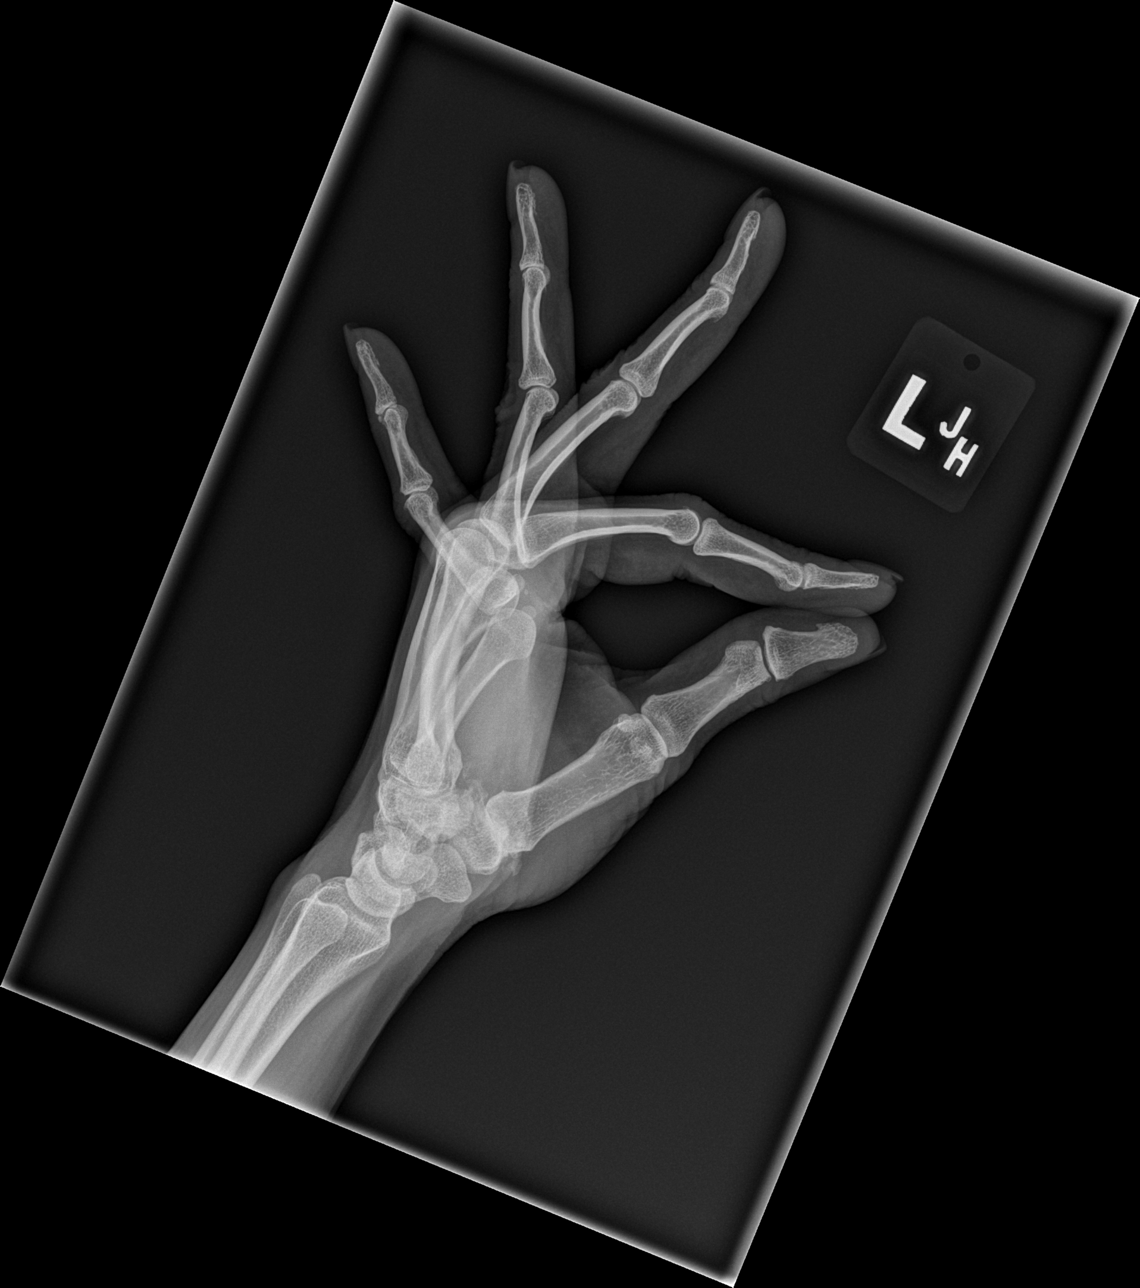

[3 of 3 positions shown; findings below may reference images not displayed]

FINDINGS: There is mild narrowing of the first CMC joint space. There
irregularity of the articular surface of the trapezium. The other
CMC joint spaces are well maintained. The MCP and IP joint spaces
are reasonably well maintained for age.
IMPRESSION: Moderate osteoarthritic changes centered on the first CMC joint.

## 2019-10-17 ENCOUNTER — Other Ambulatory Visit: Payer: BC Managed Care – PPO

## 2019-10-17 ENCOUNTER — Other Ambulatory Visit: Payer: Self-pay

## 2019-10-17 DIAGNOSIS — Z20822 Contact with and (suspected) exposure to covid-19: Secondary | ICD-10-CM

## 2019-10-19 LAB — NOVEL CORONAVIRUS, NAA: SARS-CoV-2, NAA: NOT DETECTED

## 2019-12-17 ENCOUNTER — Ambulatory Visit: Payer: BC Managed Care – PPO | Attending: Family

## 2019-12-17 DIAGNOSIS — Z23 Encounter for immunization: Secondary | ICD-10-CM

## 2020-02-13 NOTE — Progress Notes (Signed)
   Covid-19 Vaccination Clinic  Name:  Joann Jorge    MRN: [id number] DOB: 02-13-1966  02/13/2020  Ms. Esguerra was observed post Covid-19 immunization for 15 minutes without incident. She was provided with Vaccine Information Sheet and instruction to access the V-Safe system.   Ms. Oyster was instructed to call 911 with any severe reactions post vaccine: Marland Kitchen Difficulty breathing  . Swelling of face and throat  . A fast heartbeat  . A bad rash all over body  . Dizziness and weakness   Immunizations Administered    Name Date Dose VIS Date Route   Moderna Covid-19 Booster Vaccine 12/17/2019  9:45 AM 0.25 mL 12/05/2019 Intramuscular   Manufacturer: Moderna   Lot: [id number]   NDC: 86754-492-01

## 2020-05-06 ENCOUNTER — Other Ambulatory Visit: Payer: Self-pay | Admitting: Obstetrics and Gynecology

## 2020-05-06 DIAGNOSIS — Z1231 Encounter for screening mammogram for malignant neoplasm of breast: Secondary | ICD-10-CM

## 2020-06-27 ENCOUNTER — Ambulatory Visit: Payer: BC Managed Care – PPO

## 2020-06-30 ENCOUNTER — Other Ambulatory Visit: Payer: Self-pay

## 2020-06-30 ENCOUNTER — Ambulatory Visit
Admission: RE | Admit: 2020-06-30 | Discharge: 2020-06-30 | Disposition: A | Discharge status: Home / Self Care | Payer: BC Managed Care – PPO | Source: Ambulatory Visit | Attending: Obstetrics and Gynecology | Admitting: Obstetrics and Gynecology

## 2020-06-30 DIAGNOSIS — Z1231 Encounter for screening mammogram for malignant neoplasm of breast: Secondary | ICD-10-CM

## 2020-07-02 ENCOUNTER — Other Ambulatory Visit: Payer: Self-pay | Admitting: Obstetrics and Gynecology

## 2020-07-02 DIAGNOSIS — R928 Other abnormal and inconclusive findings on diagnostic imaging of breast: Secondary | ICD-10-CM

## 2020-07-09 ENCOUNTER — Other Ambulatory Visit: Payer: Self-pay | Admitting: Obstetrics and Gynecology

## 2020-07-09 ENCOUNTER — Other Ambulatory Visit: Payer: Self-pay

## 2020-07-09 ENCOUNTER — Ambulatory Visit
Admission: RE | Admit: 2020-07-09 | Discharge: 2020-07-09 | Disposition: A | Discharge status: Home / Self Care | Payer: BC Managed Care – PPO | Source: Ambulatory Visit | Attending: Obstetrics and Gynecology | Admitting: Obstetrics and Gynecology

## 2020-07-09 DIAGNOSIS — R928 Other abnormal and inconclusive findings on diagnostic imaging of breast: Secondary | ICD-10-CM

## 2020-10-09 ENCOUNTER — Other Ambulatory Visit: Payer: Self-pay

## 2020-10-09 ENCOUNTER — Ambulatory Visit: Payer: BC Managed Care – PPO | Attending: Family

## 2020-10-09 ENCOUNTER — Ambulatory Visit
Admission: RE | Admit: 2020-10-09 | Discharge: 2020-10-09 | Disposition: A | Discharge status: Home / Self Care | Payer: BC Managed Care – PPO | Source: Ambulatory Visit | Attending: Obstetrics and Gynecology | Admitting: Obstetrics and Gynecology

## 2020-10-09 DIAGNOSIS — R928 Other abnormal and inconclusive findings on diagnostic imaging of breast: Secondary | ICD-10-CM

## 2020-10-09 DIAGNOSIS — Z23 Encounter for immunization: Secondary | ICD-10-CM

## 2020-10-23 NOTE — Progress Notes (Signed)
   Covid-19 Vaccination Clinic  Name:  Kayla Finley    MRN: [id number] DOB: 1965-07-13  10/23/2020  [name] was observed post Covid-19 immunization for 15 minutes without incident. She was provided with Vaccine Information Sheet and instruction to access the V-Safe system.   [name] was instructed to call 911 with any severe reactions post vaccine: Difficulty breathing  Swelling of face and throat  A fast heartbeat  A bad rash all over body  Dizziness and weakness   Immunizations Administered     Name Date Dose VIS Date Route   Moderna Covid-19 Booster Vaccine 10/09/2020  2:30 PM 0.25 mL 12/05/2019 Intramuscular   Manufacturer: Moderna   Lot: [id number]   NDC: 30160-109-32

## 2021-06-05 ENCOUNTER — Other Ambulatory Visit: Payer: Self-pay | Admitting: Obstetrics and Gynecology

## 2021-06-05 DIAGNOSIS — Z1231 Encounter for screening mammogram for malignant neoplasm of breast: Secondary | ICD-10-CM

## 2021-07-08 ENCOUNTER — Ambulatory Visit
Admission: RE | Admit: 2021-07-08 | Discharge: 2021-07-08 | Disposition: A | Discharge status: Home / Self Care | Payer: BC Managed Care – PPO | Source: Ambulatory Visit | Attending: Obstetrics and Gynecology | Admitting: Obstetrics and Gynecology

## 2021-07-08 DIAGNOSIS — Z1231 Encounter for screening mammogram for malignant neoplasm of breast: Secondary | ICD-10-CM

## 2022-06-14 ENCOUNTER — Other Ambulatory Visit: Payer: Self-pay | Admitting: Family Medicine

## 2022-06-14 DIAGNOSIS — Z1231 Encounter for screening mammogram for malignant neoplasm of breast: Secondary | ICD-10-CM

## 2022-07-15 ENCOUNTER — Ambulatory Visit
Admission: RE | Admit: 2022-07-15 | Discharge: 2022-07-15 | Disposition: A | Discharge status: Home / Self Care | Payer: BC Managed Care – PPO | Source: Ambulatory Visit | Attending: Family Medicine | Admitting: Family Medicine

## 2022-07-15 DIAGNOSIS — Z1231 Encounter for screening mammogram for malignant neoplasm of breast: Secondary | ICD-10-CM

## 2023-04-18 ENCOUNTER — Ambulatory Visit: Payer: Self-pay | Admitting: Podiatry

## 2023-04-18 ENCOUNTER — Encounter: Payer: Self-pay | Admitting: Podiatry

## 2023-04-18 DIAGNOSIS — B351 Tinea unguium: Secondary | ICD-10-CM

## 2023-04-18 MED ORDER — TERBINAFINE HCL 250 MG PO TABS: 250.0000 mg | ORAL_TABLET | Freq: Every day | ORAL | 0 refills | Status: DC

## 2023-04-18 NOTE — Progress Notes (Signed)
   Chief Complaint  Patient presents with   Nail Problem    Toenails bilateral - discoloration in the nails x few weeks, wears nail polish a lot, history of nail fungus in the past, tried some OTC and home remedy things at home, notices some itching around the nails   New Patient (Initial Visit)    Est pt 2018    Subjective: 58 y.o. female presenting today as a reestablish new patient for evaluation of thickening with discoloration of the toenails bilateral.  In the past the patient has taken Lamisil with significant improvement.  Currently she is applying home remedy topical baking soda and vinegar.  Past Medical History:  Diagnosis Date   Migraine     Past Surgical History:  Procedure Laterality Date   BUNIONECTOMY     both feet   CESAREAN SECTION     CHOLECYSTECTOMY      No Known Allergies   B/L toes 04/18/2023  Objective: Physical Exam General: The patient is alert and oriented x3 in no acute distress.  Dermatology: Hyperkeratotic, discolored, thickened, onychodystrophy noted. Skin is warm, dry and supple bilateral lower extremities. Negative for open lesions or macerations.  Vascular: Palpable pedal pulses bilaterally. No edema or erythema noted. Capillary refill within normal limits.  Neurological: Grossly intact via light touch  Musculoskeletal Exam: No pedal deformity noted  Assessment: #1 Onychomycosis of toenails lateral  Plan of Care:  -Patient was evaluated.  Patient has completed oral Lamisil about 1 year ago and she says that it essentially resolved her discoloration and toenail fungus.  She tolerated the pill well and she has no history of liver pathology or symptoms. -Today we discussed different treatment options including oral, topical, and laser antifungal treatment modalities.  We discussed their efficacies and side effects.  Patient opts for oral antifungal treatment modality -Prescription for Lamisil 250 mg #90 daily. Pt denies a history of liver  pathology or symptoms.  Patient is otherwise healthy -Return to clinic 6 months   Felecia Shelling, DPM [facility]  Dr. Felecia Shelling, DPM    [address] [place], [place] [address]                Office [phone]  Fax [phone]

## 2023-06-16 ENCOUNTER — Other Ambulatory Visit: Payer: Self-pay | Admitting: Family Medicine

## 2023-06-16 DIAGNOSIS — Z1231 Encounter for screening mammogram for malignant neoplasm of breast: Secondary | ICD-10-CM

## 2023-07-18 ENCOUNTER — Ambulatory Visit
Admission: RE | Admit: 2023-07-18 | Discharge: 2023-07-18 | Disposition: A | Discharge status: Home / Self Care | Payer: Self-pay | Source: Ambulatory Visit | Attending: Family Medicine | Admitting: Family Medicine

## 2023-07-18 DIAGNOSIS — Z1231 Encounter for screening mammogram for malignant neoplasm of breast: Secondary | ICD-10-CM

## 2023-10-01 ENCOUNTER — Other Ambulatory Visit: Payer: Self-pay

## 2023-10-01 ENCOUNTER — Emergency Department (HOSPITAL_COMMUNITY)
Admission: EM | Admit: 2023-10-01 | Discharge: 2023-10-01 | Disposition: A | Discharge status: Home / Self Care | Source: Ambulatory Visit | Attending: Emergency Medicine | Admitting: Emergency Medicine

## 2023-10-01 ENCOUNTER — Encounter (HOSPITAL_COMMUNITY): Payer: Self-pay | Admitting: Emergency Medicine

## 2023-10-01 ENCOUNTER — Emergency Department (HOSPITAL_COMMUNITY)

## 2023-10-01 DIAGNOSIS — I4891 Unspecified atrial fibrillation: Secondary | ICD-10-CM | POA: Insufficient documentation

## 2023-10-01 DIAGNOSIS — Z7901 Long term (current) use of anticoagulants: Secondary | ICD-10-CM | POA: Insufficient documentation

## 2023-10-01 DIAGNOSIS — R002 Palpitations: Secondary | ICD-10-CM | POA: Diagnosis present

## 2023-10-01 LAB — BASIC METABOLIC PANEL WITH GFR
Anion gap: 9 (ref 5–15)
BUN: 14 mg/dL (ref 6–20)
CO2: 27 mmol/L (ref 22–32)
Calcium: 9.8 mg/dL (ref 8.9–10.3)
Chloride: 107 mmol/L (ref 98–111)
Creatinine, Ser: 0.82 mg/dL (ref 0.44–1.00)
GFR, Estimated: >60 mL/min
Glucose, Bld: 110 mg/dL — ABNORMAL HIGH (ref 70–99)
Potassium: 3.5 mmol/L (ref 3.5–5.1)
Sodium: 143 mmol/L (ref 135–145)

## 2023-10-01 LAB — CBC
HCT: 42.4 % (ref 36.0–46.0)
Hemoglobin: 13.4 g/dL (ref 12.0–15.0)
MCH: 27.7 pg (ref 26.0–34.0)
MCHC: 31.6 g/dL (ref 30.0–36.0)
MCV: 87.6 fL (ref 80.0–100.0)
Platelets: 232 K/uL (ref 150–400)
RBC: 4.84 MIL/uL (ref 3.87–5.11)
RDW: 13.9 % (ref 11.5–15.5)
WBC: 6.1 K/uL (ref 4.0–10.5)
nRBC: 0 % (ref 0.0–0.2)

## 2023-10-01 LAB — MAGNESIUM: Magnesium: 2.2 mg/dL (ref 1.7–2.4)

## 2023-10-01 LAB — T4, FREE: Free T4: 1.09 ng/dL (ref 0.61–1.12)

## 2023-10-01 LAB — TSH: TSH: 0.753 u[IU]/mL (ref 0.350–4.500)

## 2023-10-01 MED ORDER — DILTIAZEM HCL-DEXTROSE 125-5 MG/125ML-% IV SOLN (PREMIX)
5.0000 mg/h | INTRAVENOUS | Status: DC
  Administered 2023-10-01: 5 mg/h via INTRAVENOUS
  Filled 2023-10-01: qty 125

## 2023-10-01 MED ORDER — AMIODARONE IV BOLUS ONLY 150 MG/100ML
150.0000 mg | Freq: Once | INTRAVENOUS | Status: AC
  Administered 2023-10-01: 150 mg via INTRAVENOUS
  Filled 2023-10-01: qty 100

## 2023-10-01 MED ORDER — DILTIAZEM LOAD VIA INFUSION
15.0000 mg | Freq: Once | INTRAVENOUS | Status: AC
  Administered 2023-10-01: 15 mg via INTRAVENOUS
  Filled 2023-10-01: qty 15

## 2023-10-01 MED ORDER — METOPROLOL TARTRATE 25 MG PO TABS
12.5000 mg | ORAL_TABLET | Freq: Once | ORAL | Status: AC
  Administered 2023-10-01: 12.5 mg via ORAL
  Filled 2023-10-01: qty 1

## 2023-10-01 MED ORDER — SODIUM CHLORIDE 0.9 % IV BOLUS
1000.0000 mL | Freq: Once | INTRAVENOUS | Status: AC
  Administered 2023-10-01: 1000 mL via INTRAVENOUS

## 2023-10-01 MED ORDER — APIXABAN 2.5 MG PO TABS
5.0000 mg | ORAL_TABLET | Freq: Once | ORAL | Status: AC
  Administered 2023-10-01: 5 mg via ORAL
  Filled 2023-10-01: qty 2

## 2023-10-01 MED ORDER — APIXABAN 5 MG PO TABS: 5.0000 mg | ORAL_TABLET | Freq: Two times a day (BID) | ORAL | 0 refills | Status: DC

## 2023-10-01 MED ORDER — METOPROLOL TARTRATE 25 MG PO TABS: 12.5000 mg | ORAL_TABLET | Freq: Two times a day (BID) | ORAL | 0 refills | Status: DC

## 2023-10-01 MED ORDER — AMIODARONE IV BOLUS ONLY 150 MG/100ML: 300.0000 mg | Freq: Once | INTRAVENOUS | Status: DC

## 2023-10-01 NOTE — ED Provider Notes (Signed)
 [place] EMERGENCY DEPARTMENT AT [facility] Provider Note   CSN: [id number] Arrival date & time: 10/01/23  1610     Patient presents with: Palpitations   Kayla Finley is a 58 y.o. female.   Patient today started with palpitations about 4 hours ago.  Felt like it was anxiety and stress but it did not go away.  She has no major medical problems.  She denies any nausea vomiting diarrhea.  She is very active person.  She has not felt the symptoms before.  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  She denies any weakness numbness tingling.  She denies any chest pain shortness of breath.  The history is provided by the patient.       Prior to Admission medications   Medication Sig Start Date End Date Taking? Authorizing Provider  apixaban  (ELIQUIS ) 5 MG TABS tablet Take 1 tablet (5 mg total) by mouth 2 (two) times daily. 10/01/23 10/31/23 Yes Rami Waddle, DO  metoprolol  tartrate (LOPRESSOR ) 25 MG tablet Take 0.5 tablets (12.5 mg total) by mouth 2 (two) times daily. 10/01/23 10/31/23 Yes Michayla Mcneil, DO  Cetirizine HCl (ZYRTEC ALLERGY PO) Take by mouth daily as needed.    [provider]  fluticasone (FLONASE) 50 MCG/ACT nasal spray Place 1 spray into both nostrils as needed.  04/10/14   [provider]  meloxicam (MOBIC) 7.5 MG tablet Take 7.5 mg by mouth daily as needed. 03/07/23   [provider]  Multiple Vitamins-Minerals (MULTIVITAMIN ADULT PO) Take by mouth daily.    [provider]  terbinafine  (LAMISIL ) 250 MG tablet Take 1 tablet (250 mg total) by mouth daily. 04/18/23   Janit Thresa HERO, DPM  triamcinolone cream (KENALOG) 0.1 % SMARTSIG:1 Application Topical 2-3 Times Daily 04/04/23   [provider]    Allergies: Patient has no known allergies.    Review of Systems  Updated Vital Signs BP 106/73   Pulse (!) 113   Temp 98.5 F (36.9 C) (Oral)   Resp (!) 25   SpO2 95%   Physical  Exam Vitals and nursing note reviewed.  Constitutional:      General: She is not in acute distress.    Appearance: She is well-developed. She is not ill-appearing.  HENT:     Head: Normocephalic and atraumatic.     Nose: Nose normal.     Mouth/Throat:     Mouth: Mucous membranes are moist.  Eyes:     Extraocular Movements: Extraocular movements intact.     Conjunctiva/sclera: Conjunctivae normal.     Pupils: Pupils are equal, round, and reactive to light.  Cardiovascular:     Rate and Rhythm: Tachycardia present. Rhythm irregular.     Heart sounds: No murmur heard. Pulmonary:     Effort: Pulmonary effort is normal. No respiratory distress.     Breath sounds: Normal breath sounds.  Abdominal:     General: Abdomen is flat.     Palpations: Abdomen is soft.     Tenderness: There is no abdominal tenderness.  Musculoskeletal:        General: No swelling.     Cervical back: Neck supple.  Skin:    General: Skin is warm and dry.     Capillary Refill: Capillary refill takes less than 2 seconds.  Neurological:     General: No focal deficit present.     Mental Status: She is alert and oriented to person, place, and time.  Cranial Nerves: No cranial nerve deficit.     Sensory: No sensory deficit.     Motor: No weakness.     Coordination: Coordination normal.  Psychiatric:        Mood and Affect: Mood normal.     (all labs ordered are listed, but only abnormal results are displayed) Labs Reviewed  BASIC METABOLIC PANEL WITH GFR - Abnormal; Notable for the following components:      Result Value   Glucose, Bld 110 (*)    All other components within normal limits  MAGNESIUM  CBC  TSH  T4, FREE    EKG: EKG shows atrial fibrillation with RVR in the 150s.  No ischemic changes  Radiology: [facility] Chest Port 1 View Result Date: 10/01/2023 CLINICAL DATA:  New atrial fibrillation. EXAM: PORTABLE CHEST 1 VIEW COMPARISON:  None Available. FINDINGS: The cardiomediastinal contours are  normal. The lungs are clear. Pulmonary vasculature is normal. No consolidation, pleural effusion, or pneumothorax. Scoliotic curvature of the spine. No acute osseous abnormalities are seen. IMPRESSION: No acute chest findings. Electronically Signed   By: Andrea Gasman M.D.   On: 10/01/2023 17:26     .Critical Care  Performed by: Ruthe Cornet, DO Authorized by: Ruthe Cornet, DO   Critical care provider statement:    Critical care time (minutes):  35   Critical care was necessary to treat or prevent imminent or life-threatening deterioration of the following conditions: atrial fibrillation with rvr.   Critical care was time spent personally by me on the following activities:  Development of treatment plan with patient or surrogate, blood draw for specimens, discussions with consultants, evaluation of patient's response to treatment, examination of patient, obtaining history from patient or surrogate, ordering and performing treatments and interventions, ordering and review of laboratory studies, ordering and review of radiographic studies, pulse oximetry, re-evaluation of patient's condition and review of old charts   Care discussed with: admitting provider      Medications Ordered in the ED  diltiazem  (CARDIZEM ) 1 mg/mL load via infusion 15 mg (15 mg Intravenous Bolus from Bag 10/01/23 1719)    And  diltiazem  (CARDIZEM ) 125 mg in dextrose  5% 125 mL (1 mg/mL) infusion (0 mg/hr Intravenous Stopped 10/01/23 2021)  apixaban  (ELIQUIS ) tablet 5 mg (has no administration in time range)  sodium chloride  0.9 % bolus 1,000 mL (0 mLs Intravenous Stopped 10/01/23 1900)  amiodarone  (NEXTERONE ) 1.5 mg/mL IV bolus only 150 mg (0 mg Intravenous Stopped 10/01/23 1922)  metoprolol  tartrate (LOPRESSOR ) tablet 12.5 mg (12.5 mg Oral Given 10/01/23 1951)            CHA2DS2-VASc Score: 1                        Medical Decision Making Amount and/or Complexity of Data Reviewed Labs: ordered. Radiology:  ordered.  Risk Prescription drug management.   [name] is here with new A-fib with RVR.  Sounds like symptoms started a couple hours ago.  She has no major medical problems.  Will start her on IV diltiazem  and IV fluid bolus and check basic labs.  It is possible that we can likely cardiovert her if she does not respond as it sounds like this started about 4 hours ago.  [place] Vascor is 1.  Will get basic labs thyroid  studies reevaluate.  EKG per my review interpretation shows atrial fibrillation with RVR in the 150s.  Overall lab work for my review and interpretation was unremarkable.  No significant leukocytosis anemia or electrolyte abnormality.  Thyroid  studies unremarkable.  I talked with Dr. Vannie with cardiology.  Patient was not having much improvement with IV diltiazem .  But she did not want to have cardioversion done.  Ultimately we gave her a dose of 150 of amiodarone .  We uptitrated diltiazem  and heart rate became more rate controlled in the 80s and 90s.  Patient was given Lopressor  orally 12.5.  Diltiazem  drip was turned off and patient did well.  Blood pressure remained normal.  Heart rate remained between 80 and 110.  Ultimately patient was given Eliquis .  She was educated about Eliquis  and being on blood thinners.  She will follow-up in atrial fibrillation clinic told to return if symptoms worsen or heart rate was erratic greater than 150 or consistently in the 130-150 range.  Patient discharged.  Understands return precautions.  This chart was dictated using voice recognition software.  Despite best efforts to proofread,  errors can occur which can change the documentation meaning.      Final diagnoses:  Atrial fibrillation with rapid ventricular response [facility]    ED Discharge Orders          Ordered    metoprolol  tartrate (LOPRESSOR ) 25 MG tablet  2 times daily        10/01/23 2050    Amb Referral to AFIB Clinic        10/01/23 2050    apixaban  (ELIQUIS ) 5 MG  TABS tablet  2 times daily        10/01/23 2050               Ruthe Cornet, DO 10/01/23 2053

## 2023-10-01 NOTE — ED Triage Notes (Addendum)
 Patient presents due to palpitations around 1330 today. She reports heart was racing. Urgent Care told her to come here due to tachycardia and irregular rate. She denies dizziness, lightheaded, shortness of breath and chest pain.

## 2023-10-01 NOTE — ED Provider Triage Note (Signed)
 Emergency Medicine Provider Triage Evaluation Note  Kayla Finley , a 58 y.o. female  was evaluated in triage.  Pt complains of Sudden onset racing heart an palpitations strarting at 1:30 pm today. Went to [facility] found to be in new onset arib w rvr. No etoh, drugs. Or known thyroid   Rare etoh  Review of Systems  Positive: Racing hear Negative: fever  Physical Exam  BP 100/75 (BP Location: Right Arm)   Pulse (!) 161   Temp 98.5 F (36.9 C) (Oral)   Resp 16   SpO2 99%  Gen:   Awake, no distress   Resp:  Normal effort  MSK:   Moves extremities without difficulty  Other:  Tachy, irregular  Medical Decision Making  Medically screening exam initiated at 4:45 PM.  Appropriate orders placed.  Kayla Finley was informed that the remainder of the evaluation will be completed by another provider, this initial triage assessment does not replace that evaluation, and the importance of remaining in the ED until their evaluation is complete.     Arloa Chroman, PA-C 10/01/23 [phone]

## 2023-10-01 NOTE — Discharge Instructions (Addendum)
 Take your next dose of metoprolol  tomorrow morning.  Take your next dose of blood thinner Eliquis  tomorrow morning.  If your heart rate is persistently greater than 150 or you are having severe symptoms please return for evaluation as we discussed.  Follow-up in atrial fibrillation clinic.  As we discussed you are currently on a blood thinner.  If you have any significant trauma especially head trauma you should consider getting evaluated for that.  If you have any heavy bleeding from your vagina or your rectum also please be evaluated.    Information on my medicine - ELIQUIS  (apixaban )  This medication education was reviewed with me or my healthcare representative as part of my discharge preparation.  The pharmacist that spoke with me during my hospital stay was:  Analysa Nutting A, RPH  Why was Eliquis  prescribed for you? Eliquis  was prescribed for you to reduce the risk of a blood clot forming that can cause a stroke if you have a medical condition called atrial fibrillation (a type of irregular heartbeat).  What do You need to know about Eliquis  ? Take your Eliquis  TWICE DAILY - one tablet in the morning and one tablet in the evening with or without food. If you have difficulty swallowing the tablet whole please discuss with your pharmacist how to take the medication safely.  Take Eliquis  exactly as prescribed by your doctor and DO NOT stop taking Eliquis  without talking to the doctor who prescribed the medication.  Stopping may increase your risk of developing a stroke.  Refill your prescription before you run out.  After discharge, you should have regular check-up appointments with your healthcare provider that is prescribing your Eliquis .  In the future your dose may need to be changed if your kidney function or weight changes by a significant amount or as you get older.  What do you do if you miss a dose? If you miss a dose, take it as soon as you remember on the same day and resume  taking twice daily.  Do not take more than one dose of ELIQUIS  at the same time to make up a missed dose.  Important Safety Information A possible side effect of Eliquis  is bleeding. You should call your healthcare provider right away if you experience any of the following: Bleeding from an injury or your nose that does not stop. Unusual colored urine (red or dark brown) or unusual colored stools (red or black). Unusual bruising for unknown reasons. A serious fall or if you hit your head (even if there is no bleeding).  Some medicines may interact with Eliquis  and might increase your risk of bleeding or clotting while on Eliquis . To help avoid this, consult your healthcare provider or pharmacist prior to using any new prescription or non-prescription medications, including herbals, vitamins, non-steroidal anti-inflammatory drugs (NSAIDs) and supplements.  This website has more information on Eliquis  (apixaban ): http://[email or web address] .com/eliquis dena

## 2023-10-14 ENCOUNTER — Ambulatory Visit (HOSPITAL_COMMUNITY)
Admission: RE | Admit: 2023-10-14 | Discharge: 2023-10-14 | Disposition: A | Discharge status: Home / Self Care | Source: Ambulatory Visit | Attending: Physician Assistant | Admitting: Physician Assistant

## 2023-10-14 ENCOUNTER — Other Ambulatory Visit (HOSPITAL_COMMUNITY): Payer: Self-pay

## 2023-10-14 VITALS — BP 110/66 | HR 69 | Ht 65.5 in | Wt 143.8 lb

## 2023-10-14 DIAGNOSIS — I4891 Unspecified atrial fibrillation: Secondary | ICD-10-CM | POA: Diagnosis not present

## 2023-10-14 DIAGNOSIS — I480 Paroxysmal atrial fibrillation: Secondary | ICD-10-CM | POA: Diagnosis not present

## 2023-10-14 MED ORDER — METOPROLOL TARTRATE 25 MG PO TABS: 12.5000 mg | ORAL_TABLET | Freq: Four times a day (QID) | ORAL | 2 refills | Status: AC | PRN

## 2023-10-14 NOTE — Progress Notes (Signed)
 Primary Care Physician: Kip Righter, MD Primary Cardiologist: Shelda Bruckner, MD Electrophysiologist: None  Referring Physician: ED   Kayla Finley is a 58 y.o. female with a history of atrial fibrillation who presents for consultation in the [facility].  The patient was initially diagnosed with atrial fibrillation 10/01/23 after presenting to the ED with symptoms of palpitations. ECG showed afib with RVR, started on rate controlling medications and Eliquis  for stroke prevention. Patient deferred DCCV at that time. She was seen by Dr Bruckner in 2020 with palpitations. Cardiac monitor at that time did not show any afib or flutter.   Today, patient is in SR and feeling well. She had decreased her medications because she noticed increased headaches and some dizziness. No bleeding issues. She denies significant snoring or alcohol use.   Today, she denies symptoms of palpitations, chest pain, shortness of breath, orthopnea, PND, lower extremity edema, dizziness, presyncope, syncope, snoring, daytime somnolence, bleeding, or neurologic sequela. The patient is tolerating medications without difficulties and is otherwise without complaint today.    Atrial Fibrillation Risk Factors:  she does not have symptoms or diagnosis of sleep apnea. she does not have a history of rheumatic fever. she does not have a history of alcohol use. The patient does not have a history of early familial atrial fibrillation or other arrhythmias.  Atrial Fibrillation Management history:  Previous antiarrhythmic drugs: none Previous cardioversions: none Previous ablations: none Anticoagulation history: Eliquis   ROS- All systems are reviewed and negative except as per the HPI above.  Past Medical History:  Diagnosis Date   Migraine     Current Outpatient Medications  Medication Sig Dispense Refill   acetaminophen (TYLENOL) 500 MG tablet Takes 2000 mg by mouth  daily for migraines, almost every day     apixaban  (ELIQUIS ) 5 MG TABS tablet Take 1 tablet (5 mg total) by mouth 2 (two) times daily. 60 tablet 0   APPLE CIDER VINEGAR PO Taking 2 gummies by mouth daily     Ascorbic Acid (VITAMIN C PO) Take 1,000 mg by mouth every morning.     Collagen-Vitamin C-Biotin (COLLAGEN PO) Spoiled child -Taking 1-2 tablespoons once daily     eletriptan (RELPAX) 40 MG tablet 1 tablet as needed at start of migraine. may repeat one time after 2 hours. max of 2 doses per 24 hours Orally as directed     fluticasone (FLONASE) 50 MCG/ACT nasal spray Place 1 spray into both nostrils as needed.   2   LECITHIN PO Take 1,200 mg by mouth every morning.     loratadine (CLARITIN) 10 MG tablet Take 10 mg by mouth.     MAGNESIUM CITRATE PO Take 150 mg by mouth every morning.     meloxicam (MOBIC) 7.5 MG tablet Take 7.5 mg by mouth daily as needed. (Patient taking differently: Take 7.5 mg by mouth every other day.)     metoprolol  tartrate (LOPRESSOR ) 25 MG tablet Take 0.5 tablets (12.5 mg total) by mouth 2 (two) times daily. 15 tablet 0   Multiple Vitamin (ONE-DAILY MULTI VITAMINS PO) Taking 2 gummies by mouth daily- Goli     Multiple Vitamins-Minerals (MULTIVITAMIN ADULT PO) Take by mouth daily.     triamcinolone cream (KENALOG) 0.1 % SMARTSIG:1 Application Topical 2-3 Times Daily (Patient taking differently: Apply 1 Application topically as needed.)     No current facility-administered medications for this encounter.    Physical Exam: Ht 5' 5.5 (1.664 m)   Wt 65.2 kg  BMI 23.57 kg/m   GEN: Well nourished, well developed in no acute distress NECK: No JVD CARDIAC: Regular rate and rhythm, no murmurs, rubs, gallops RESPIRATORY:  Clear to auscultation without rales, wheezing or rhonchi  ABDOMEN: Soft, non-tender, non-distended EXTREMITIES:  No edema; No deformity   Wt Readings from Last 3 Encounters:  10/14/23 65.2 kg  03/23/18 66 kg  01/29/16 63 kg     EKG today  demonstrates  SR Vent. rate 69 BPM PR interval 138 ms QRS duration 86 ms QT/QTcB 374/400 ms    CHA2DS2-VASc Score = 1  The patient's score is based upon: CHF History: 0 HTN History: 0 Diabetes History: 0 Stroke History: 0 Vascular Disease History: 0 Age Score: 0 Gender Score: 1       ASSESSMENT AND PLAN: Paroxysmal Atrial Fibrillation (ICD10:  I48.0) The patient's CHA2DS2-VASc score is 1, indicating a 0.6% annual risk of stroke.   General education about afib provided and questions answered. We also discussed her stroke risk and the risks and benefits of anticoagulation. Check echocardiogram Will discontinue Eliquis  given her low CV score.  Change Lopressor  to 12.5 mg q 6 hours PRN for heart racing. Discussed Apple Watch for home monitoring.  If she has frequent afib, can consider AAD or ablation. Would likely favor ablation given her young age.     Follow up in the AF clinic in 4-6 weeks.     [facility] [address] [place], [place] [address] [phone]

## 2023-10-14 NOTE — Patient Instructions (Signed)
 Metoprolol  12.5 mg every 6 hours only if needed for elevated HR above 100

## 2023-10-19 ENCOUNTER — Ambulatory Visit: Admitting: Podiatry

## 2023-10-20 ENCOUNTER — Ambulatory Visit (HOSPITAL_COMMUNITY)
Admission: RE | Admit: 2023-10-20 | Discharge: 2023-10-20 | Disposition: A | Discharge status: Home / Self Care | Source: Ambulatory Visit | Attending: Physician Assistant | Admitting: Physician Assistant

## 2023-10-20 DIAGNOSIS — I4891 Unspecified atrial fibrillation: Secondary | ICD-10-CM | POA: Insufficient documentation

## 2023-10-20 DIAGNOSIS — I371 Nonrheumatic pulmonary valve insufficiency: Secondary | ICD-10-CM | POA: Diagnosis not present

## 2023-10-20 LAB — ECHOCARDIOGRAM COMPLETE
AR max vel: 2.43 cm2
AV Area VTI: 2.38 cm2
AV Area mean vel: 2.33 cm2
AV Mean grad: 5 mmHg
AV Peak grad: 8.4 mmHg
Ao pk vel: 1.45 m/s
Area-P 1/2: 4.04 cm2
S' Lateral: 3.3 cm

## 2023-10-21 ENCOUNTER — Ambulatory Visit (HOSPITAL_COMMUNITY): Payer: Self-pay | Admitting: Physician Assistant

## 2023-11-14 ENCOUNTER — Ambulatory Visit (HOSPITAL_COMMUNITY)
Admission: RE | Admit: 2023-11-14 | Discharge: 2023-11-14 | Disposition: A | Discharge status: Home / Self Care | Source: Ambulatory Visit | Attending: Physician Assistant | Admitting: Physician Assistant

## 2023-11-14 VITALS — BP 122/76 | HR 78 | Ht 65.5 in | Wt 141.2 lb

## 2023-11-14 DIAGNOSIS — I480 Paroxysmal atrial fibrillation: Secondary | ICD-10-CM | POA: Diagnosis not present

## 2023-11-14 DIAGNOSIS — I4891 Unspecified atrial fibrillation: Secondary | ICD-10-CM

## 2023-11-14 NOTE — Progress Notes (Signed)
 Primary Care Physician: Kip Righter, MD Primary Cardiologist: Shelda Bruckner, MD Electrophysiologist: None  Referring Physician: ED   Kayla Finley is a 58 y.o. female with a history of atrial fibrillation who presents for follow up in the [facility].  The patient was initially diagnosed with atrial fibrillation 10/01/23 after presenting to the ED with symptoms of palpitations. ECG showed afib with RVR, started on rate controlling medications and Eliquis  for stroke prevention. Patient deferred DCCV at that time. She was seen by Dr Bruckner in 2020 with palpitations. Cardiac monitor at that time did not show any afib or flutter.   Patient returns for follow up for atrial fibrillation. She is in SR today and feels well. She denies any interim symptoms of afib. She had an echo which showed LVEF 50-55%, mild MVP.  Today, she  denies symptoms of palpitations, chest pain, shortness of breath, orthopnea, PND, lower extremity edema, dizziness, presyncope, syncope, snoring, daytime somnolence, bleeding, or neurologic sequela. The patient is tolerating medications without difficulties and is otherwise without complaint today.    Atrial Fibrillation Risk Factors:  she does not have symptoms or diagnosis of sleep apnea. she does not have a history of rheumatic fever. she does not have a history of alcohol use. The patient does not have a history of early familial atrial fibrillation or other arrhythmias.  Atrial Fibrillation Management history:  Previous antiarrhythmic drugs: none Previous cardioversions: none Previous ablations: none Anticoagulation history: Eliquis   ROS- All systems are reviewed and negative except as per the HPI above.  Past Medical History:  Diagnosis Date   Migraine     Current Outpatient Medications  Medication Sig Dispense Refill   acetaminophen (TYLENOL) 500 MG tablet Takes 2000 mg by mouth daily for migraines,  almost every day     APPLE CIDER VINEGAR PO Taking 2 gummies by mouth daily     Ascorbic Acid (VITAMIN C PO) Take 1,000 mg by mouth every morning.     Collagen-Vitamin C-Biotin (COLLAGEN PO) Spoiled child -Taking 1-2 tablespoons once daily     eletriptan (RELPAX) 40 MG tablet 1 tablet as needed at start of migraine. may repeat one time after 2 hours. max of 2 doses per 24 hours Orally as directed     fluticasone (FLONASE) 50 MCG/ACT nasal spray Place 1 spray into both nostrils as needed.   2   LECITHIN PO Take 1,200 mg by mouth every morning.     loratadine (CLARITIN) 10 MG tablet Take 10 mg by mouth. (Patient taking differently: Take 10 mg by mouth daily.)     MAGNESIUM CITRATE PO Take 150 mg by mouth every morning.     meloxicam (MOBIC) 7.5 MG tablet Take 7.5 mg by mouth daily as needed.     metoprolol  tartrate (LOPRESSOR ) 25 MG tablet Take 0.5 tablets (12.5 mg total) by mouth every 6 (six) hours as needed (take if HR elevated above 100). 15 tablet 2   Multiple Vitamin (ONE-DAILY MULTI VITAMINS PO) Taking 2 gummies by mouth daily- Goli     Multiple Vitamins-Minerals (MULTIVITAMIN ADULT PO) Take by mouth daily. (Patient taking differently: Take 1 tablet by mouth daily.)     triamcinolone cream (KENALOG) 0.1 % SMARTSIG:1 Application Topical 2-3 Times Daily     No current facility-administered medications for this encounter.    Physical Exam: BP 122/76   Pulse 78   Ht 5' 5.5 (1.664 m)   Wt 64 kg   BMI 23.14 kg/m  GEN: Well nourished, well developed in no acute distress CARDIAC: Regular rate and rhythm, no murmurs, rubs, gallops RESPIRATORY:  Clear to auscultation without rales, wheezing or rhonchi  ABDOMEN: Soft, non-tender, non-distended EXTREMITIES:  No edema; No deformity    Wt Readings from Last 3 Encounters:  11/14/23 64 kg  10/14/23 65.2 kg  03/23/18 66 kg     EKG today demonstrates  SR Vent. rate 78 BPM PR interval 136 ms QRS duration 80 ms QT/QTcB 344/392  ms   Echo 10/20/23  1. Left ventricular ejection fraction, by estimation, is 50 to 55%. The  left ventricle has low normal function. The left ventricle has no regional  wall motion abnormalities. Left ventricular diastolic parameters were  normal. The average left ventricular global longitudinal strain is -19.4 %. The global longitudinal strain is normal.   2. Right ventricular systolic function is normal. The right ventricular  size is normal.   3. The mitral valve is grossly normal. Trivial mitral valve  regurgitation. No evidence of mitral stenosis. 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   4. The aortic valve is tricuspid. Aortic valve regurgitation is not  visualized. No aortic stenosis is present.   5. The inferior vena cava is normal in size with greater than 50%  respiratory variability, suggesting right atrial pressure of 3 mmHg.     CHA2DS2-VASc Score = 1  The patient's score is based upon: CHF History: 0 HTN History: 0 Diabetes History: 0 Stroke History: 0 Vascular Disease History: 0 Age Score: 0 Gender Score: 1       ASSESSMENT AND PLAN: Paroxysmal Atrial Fibrillation (ICD10:  I48.0) The patient's CHA2DS2-VASc score is 1, indicating a 0.6% annual risk of stroke.   Patient appears to be maintaining SR Continue Lopressor  12.5 mg PRN q 6 hours for heart racing. If she has recurrent afib, can consider AAD or ablation.  Apple Watch for home monitoring.   VHD Mild MVP, trivial MR Will refer her to reestablish care with cardiology.    Follow up to reestablish care with Dr Lonni in 6 months. AF clinic as needed.     [facility] [address] [place], [place] [address] [phone]

## 2023-11-18 ENCOUNTER — Ambulatory Visit (HOSPITAL_COMMUNITY): Admitting: Physician Assistant

## 2024-05-03 ENCOUNTER — Ambulatory Visit (HOSPITAL_BASED_OUTPATIENT_CLINIC_OR_DEPARTMENT_OTHER): Admitting: Cardiology
